# Patient Record
Sex: Male | Born: 1945 | Race: White | Hispanic: No | Marital: Married | Smoking: Former smoker
Health system: Southern US, Community
[De-identification: ages and names within clinical notes are randomized; demographics above are authoritative.]

## PROBLEM LIST (undated history)

## (undated) DIAGNOSIS — I10 Essential (primary) hypertension: Secondary | ICD-10-CM

## (undated) HISTORY — PX: TONSILLECTOMY: SUR1361

## (undated) HISTORY — PX: CHOLECYSTECTOMY: SHX55

## (undated) HISTORY — DX: Essential (primary) hypertension: I10

---

## 2002-03-23 ENCOUNTER — Ambulatory Visit (HOSPITAL_COMMUNITY): Admission: RE | Admit: 2002-03-23 | Discharge: 2002-03-23 | Payer: Self-pay | Admitting: Gastroenterology

## 2003-12-30 ENCOUNTER — Observation Stay (HOSPITAL_COMMUNITY): Admission: EM | Admit: 2003-12-30 | Discharge: 2003-12-31 | Payer: Self-pay | Admitting: Emergency Medicine

## 2012-12-04 DIAGNOSIS — J019 Acute sinusitis, unspecified: Secondary | ICD-10-CM | POA: Insufficient documentation

## 2015-09-29 DIAGNOSIS — J209 Acute bronchitis, unspecified: Secondary | ICD-10-CM | POA: Diagnosis not present

## 2016-04-27 DIAGNOSIS — Z125 Encounter for screening for malignant neoplasm of prostate: Secondary | ICD-10-CM | POA: Diagnosis not present

## 2016-04-27 DIAGNOSIS — D509 Iron deficiency anemia, unspecified: Secondary | ICD-10-CM | POA: Diagnosis not present

## 2016-04-27 DIAGNOSIS — Z0000 Encounter for general adult medical examination without abnormal findings: Secondary | ICD-10-CM | POA: Diagnosis not present

## 2016-04-27 DIAGNOSIS — I10 Essential (primary) hypertension: Secondary | ICD-10-CM | POA: Diagnosis not present

## 2016-04-27 DIAGNOSIS — E782 Mixed hyperlipidemia: Secondary | ICD-10-CM | POA: Diagnosis not present

## 2016-04-27 DIAGNOSIS — K219 Gastro-esophageal reflux disease without esophagitis: Secondary | ICD-10-CM | POA: Diagnosis not present

## 2016-04-27 DIAGNOSIS — Z23 Encounter for immunization: Secondary | ICD-10-CM | POA: Diagnosis not present

## 2016-04-27 DIAGNOSIS — L570 Actinic keratosis: Secondary | ICD-10-CM | POA: Diagnosis not present

## 2016-10-09 DIAGNOSIS — L570 Actinic keratosis: Secondary | ICD-10-CM | POA: Diagnosis not present

## 2016-10-09 DIAGNOSIS — B351 Tinea unguium: Secondary | ICD-10-CM | POA: Diagnosis not present

## 2017-06-04 DIAGNOSIS — Z0000 Encounter for general adult medical examination without abnormal findings: Secondary | ICD-10-CM | POA: Diagnosis not present

## 2017-06-04 DIAGNOSIS — I10 Essential (primary) hypertension: Secondary | ICD-10-CM | POA: Diagnosis not present

## 2017-06-04 DIAGNOSIS — Z23 Encounter for immunization: Secondary | ICD-10-CM | POA: Diagnosis not present

## 2017-06-04 DIAGNOSIS — K219 Gastro-esophageal reflux disease without esophagitis: Secondary | ICD-10-CM | POA: Diagnosis not present

## 2017-09-11 DIAGNOSIS — Z0000 Encounter for general adult medical examination without abnormal findings: Secondary | ICD-10-CM | POA: Diagnosis not present

## 2017-11-25 DIAGNOSIS — H43811 Vitreous degeneration, right eye: Secondary | ICD-10-CM | POA: Diagnosis not present

## 2017-11-25 DIAGNOSIS — I959 Hypotension, unspecified: Secondary | ICD-10-CM | POA: Diagnosis not present

## 2017-11-25 DIAGNOSIS — R002 Palpitations: Secondary | ICD-10-CM | POA: Diagnosis not present

## 2017-11-25 DIAGNOSIS — I10 Essential (primary) hypertension: Secondary | ICD-10-CM | POA: Diagnosis not present

## 2017-11-25 DIAGNOSIS — H35372 Puckering of macula, left eye: Secondary | ICD-10-CM | POA: Diagnosis not present

## 2017-11-25 DIAGNOSIS — H25813 Combined forms of age-related cataract, bilateral: Secondary | ICD-10-CM | POA: Diagnosis not present

## 2018-01-09 DIAGNOSIS — H25813 Combined forms of age-related cataract, bilateral: Secondary | ICD-10-CM | POA: Diagnosis not present

## 2018-01-09 DIAGNOSIS — H43811 Vitreous degeneration, right eye: Secondary | ICD-10-CM | POA: Diagnosis not present

## 2018-01-09 DIAGNOSIS — H35372 Puckering of macula, left eye: Secondary | ICD-10-CM | POA: Diagnosis not present

## 2018-01-09 DIAGNOSIS — H04123 Dry eye syndrome of bilateral lacrimal glands: Secondary | ICD-10-CM | POA: Diagnosis not present

## 2018-02-05 DIAGNOSIS — R002 Palpitations: Secondary | ICD-10-CM | POA: Diagnosis not present

## 2018-02-05 DIAGNOSIS — R509 Fever, unspecified: Secondary | ICD-10-CM | POA: Diagnosis not present

## 2018-02-05 DIAGNOSIS — I959 Hypotension, unspecified: Secondary | ICD-10-CM | POA: Diagnosis not present

## 2018-02-05 DIAGNOSIS — I10 Essential (primary) hypertension: Secondary | ICD-10-CM | POA: Diagnosis not present

## 2018-03-12 ENCOUNTER — Encounter: Payer: Self-pay | Admitting: Cardiology

## 2018-03-12 ENCOUNTER — Ambulatory Visit: Payer: Medicare Other | Admitting: Cardiology

## 2018-03-12 VITALS — BP 138/84 | HR 81 | Ht 69.0 in | Wt 196.0 lb

## 2018-03-12 DIAGNOSIS — R002 Palpitations: Secondary | ICD-10-CM | POA: Diagnosis not present

## 2018-03-12 DIAGNOSIS — I10 Essential (primary) hypertension: Secondary | ICD-10-CM | POA: Diagnosis not present

## 2018-03-12 DIAGNOSIS — H25813 Combined forms of age-related cataract, bilateral: Secondary | ICD-10-CM | POA: Diagnosis not present

## 2018-03-12 DIAGNOSIS — H35372 Puckering of macula, left eye: Secondary | ICD-10-CM | POA: Diagnosis not present

## 2018-03-12 DIAGNOSIS — H40013 Open angle with borderline findings, low risk, bilateral: Secondary | ICD-10-CM | POA: Diagnosis not present

## 2018-03-12 DIAGNOSIS — I959 Hypotension, unspecified: Secondary | ICD-10-CM | POA: Diagnosis not present

## 2018-03-12 DIAGNOSIS — H43811 Vitreous degeneration, right eye: Secondary | ICD-10-CM | POA: Diagnosis not present

## 2018-03-12 NOTE — Patient Instructions (Addendum)
Medication Instructions:  Your physician recommends that you continue on your current medications as directed. Please refer to the Current Medication list given to you today.   Labwork: Your physician recommends that you return for lab work today: TSH.   Testing/Procedures: You had an ekg today.  Follow-Up: Your physician wants you to follow-up in: 8 weeks. You will receive a reminder letter in the mail two months in advance. If you don't receive a letter, please call our office to schedule the follow-up appointment.   Any Other Special Instructions Will Be Listed Below (If Applicable).     If you need a refill on your cardiac medications before your next appointment, please call your pharmacy.    KardiaMobile [email or web address]        FDA-cleared, clinical grade mobile EKG monitor: Jodelle Red is the most clinically-validated mobile EKG used by the world's leading cardiac care medical professionals With Basic service, know instantly if your heart rhythm is normal or if atrial fibrillation is detected, and email the last single EKG recording to yourself or your doctor Premium service, available for purchase through the Kardia app for $9.99 per month or $99 per year, includes unlimited history and storage of your EKG recordings, a monthly EKG summary report to share with your doctor, along with the ability to track your blood pressure, activity and weight Includes one KardiaMobile phone clip FREE SHIPPING: Standard delivery 1-3 business days. Orders placed by 11:00am PST will ship that afternoon. Otherwise, will ship next business day. All orders ship via [employer] from [place], [place]   1. Avoid all over-the-counter antihistamines except Claritin/Loratadine and Zyrtec/Cetrizine. 2. Avoid all combination including cold sinus allergies flu decongestant and sleep medications 3. You can use Robitussin DM Mucinex and Mucinex DM for cough. 4. can use  Tylenol aspirin ibuprofen and naproxen but no combinations such as sleep or sinus.

## 2018-03-12 NOTE — Progress Notes (Signed)
Cardiology Office Note:    Date:  03/12/2018   ID:  Gregory Vasquez, DOB [birth year], MRN [id number]  PCP:  [facility] [place]  Cardiologist:  Shirlee More, MD   Referring MD: [facility]  ASSESSMENT:    1. Hypotension, unspecified hypotension type   2. Essential hypertension   3. Palpitations    PLAN:    In order of problems listed above:  1. Symp blood pressures at target outside of these episodes tomatic hypotension is associated with this irregular and more rapid heart rhythm clinically I suspect atrial fibrillation we will utilize an smart phone adapter to catch episodes and guide treatment.  If as I suspect his atrial fibrillation he benefit from anticoagulation he has a moderate stroke risk with age hypertension and an antiarrhythmic drug to mitigate symptoms.  Prior to start antiarrhythmic drug I would do an echocardiogram to look for structural heart disease. 2. Stable continue current treatment ACE inhibitor calcium channel blocker 3. Episodes of more frequent prolonged and warrant further evaluation as described.  Check TSH because of strong association of arrhythmia especially atrial with thyroid disease.  I also asked him avoid over-the-counter proarrhythmic drugs  Next appointment 8 weeks   Medication Adjustments/Labs and Tests Ordered: Current medicines are reviewed at length with the patient today.  Concerns regarding medicines are outlined above.  Orders Placed This Encounter  Procedures  . TSH  . EKG 12-Lead   No orders of the defined types were placed in this encounter.    Chief Complaint  Patient presents with  . New Patient (Initial Visit)  . Hypotension  . Hypertension  . Palpitations    History of Present Illness:    Gregory Vasquez is a 72 y.o. male who is being seen today for the evaluation of hypotension at the request of Pa, [facility] He has a history of hypertension hyperlipidemia erectile  dysfunction he takes sildenafil , a calcium channel blocker  And ACE inhibitor.  He has had symptomatic hypotension with blood pressures less than 90 systolic as well as palpitation.  2 EKGs from years ago were normal last labs in October 2018 CBC CMP were normal.   He is having frequent episodes of his pulse beating more rapidly rates 100 215 forcefully and irregularly.  During these episodes he is lightheaded if he stands and his blood pressures less than 90 systolic.  There is no documentation of arrhythmia when it occurs it lasts up to a few hours.  And now is happening several times per week.  Many years ago he underwent evaluation for this including a 30-day event monitor that was unrevealing.  He has no known heart disease no alcohol abuse or sleep apnea his last labs were last October I do not see a thyroid and I will draw a TSH because of strong association of thyroid disease and atrial arrhythmia.  Clinically I suspect he is in paroxysmal atrial fibrillation he is at a moderate stroke risk with age and hypertension and if documented he would benefit from anticoagulation and antiarrhythmic drug to alleviate symptoms.  We discussed arming him with an event monitor but he is aware of the smart phone adapter Jodelle Red and he prefers to purchase this and record himself and was sent to my office by fax.  I will plan to see him back in the office in 8 weeks.  At this time I do not think he requires cardiac imaging or ischemia evaluation.  He is  having no chest pain no shortness of breath and outside of these episodes he really feels quite well and is vigorous and active.  Past Medical History:  Diagnosis Date  . Hypertension     Past Surgical History:  Procedure Laterality Date  . CHOLECYSTECTOMY    . TONSILLECTOMY      Current Medications: Current Meds  Medication Sig  . amLODipine (NORVASC) 5 MG tablet Take 5 mg by mouth daily.  Marland Kitchen aspirin 81 MG tablet Take 81 mg by mouth daily.  . Multiple  Vitamins-Minerals (MULTIVITAL PO) Take 1 tablet by mouth daily.   . Omega-3 Fatty Acids (FISH OIL PO) Take 1 tablet by mouth daily.   Marland Kitchen omeprazole (PRILOSEC) 20 MG capsule Take 20 mg by mouth daily.  . quinapril (ACCUPRIL) 40 MG tablet Take 20 mg by mouth daily.      Allergies:   Patient has no allergy information on record.   Social History   Socioeconomic History  . Marital status: Married    Spouse name: Not on file  . Number of children: Not on file  . Years of education: Not on file  . Highest education level: Not on file  Occupational History  . Not on file  Social Needs  . Financial resource strain: Not on file  . Food insecurity:    Worry: Not on file    Inability: Not on file  . Transportation needs:    Medical: Not on file    Non-medical: Not on file  Tobacco Use  . Smoking status: Former Smoker    Packs/day: 2.00    Years: 10.00    Pack years: 20.00    Types: Cigarettes    Last attempt to quit: 1976    Years since quitting: 43.5  . Smokeless tobacco: Never Used  Substance and Sexual Activity  . Alcohol use: Yes    Alcohol/week: 0.6 oz    Types: 1 Cans of beer per week    Comment: 1 beer every 2 -3 days  . Drug use: Not Currently  . Sexual activity: Not on file  Lifestyle  . Physical activity:    Days per week: Not on file    Minutes per session: Not on file  . Stress: Not on file  Relationships  . Social connections:    Talks on phone: Not on file    Gets together: Not on file    Attends religious service: Not on file    Active member of club or organization: Not on file    Attends meetings of clubs or organizations: Not on file    Relationship status: Not on file  Other Topics Concern  . Not on file  Social History Narrative  . Not on file     Family History: The patient's family history includes CAD in his mother; Heart attack in his father; Hyperlipidemia in his mother.  ROS:   Review of Systems  Constitution: Negative.  HENT: Negative.    Eyes: Negative.   Cardiovascular: Positive for palpitations.  Respiratory: Negative.   Endocrine: Negative.   Hematologic/Lymphatic: Negative.   Skin: Negative.   Musculoskeletal: Negative.   Gastrointestinal: Negative.   Genitourinary: Negative.   Neurological: Positive for dizziness.  Psychiatric/Behavioral: Negative.   Allergic/Immunologic: Negative.    Please see the history of present illness.     All other systems reviewed and are negative.  EKGs/Labs/Other Studies Reviewed:    The following studies were reviewed today:   EKG:  EKG is ordered  today.  The ekg ordered today demonstrates [facility] and is normal  Recent Labs: October 2018 CBC and CMP were normal No results found for requested labs within last 8760 hours.  Recent Lipid Panel No results found for: CHOL, TRIG, HDL, CHOLHDL, VLDL, LDLCALC, LDLDIRECT  Physical Exam:    VS:  BP 138/84 (BP Location: Left Arm, Patient Position: Sitting, Cuff Size: Normal)   Pulse 81   Ht 5\' 9"  (1.753 m)   Wt 196 lb (88.9 kg)   SpO2 98%   BMI 28.94 kg/m     Wt Readings from Last 3 Encounters:  03/12/18 196 lb (88.9 kg)     GEN: Well nourished, well developed in no acute distress HEENT: Normal NECK: No JVD; No carotid bruits LYMPHATICS: No lymphadenopathy CARDIAC: RRR, no murmurs, rubs, gallops RESPIRATORY:  Clear to auscultation without rales, wheezing or rhonchi  ABDOMEN: Soft, non-tender, non-distended MUSCULOSKELETAL:  No edema; No deformity  SKIN: Warm and dry NEUROLOGIC:  Alert and oriented x 3 PSYCHIATRIC:  Normal affect     Signed, Shirlee More, MD  03/12/2018 3:42 PM    [facility]

## 2018-03-13 LAB — TSH: TSH: 1.48 u[IU]/mL (ref 0.450–4.500)

## 2018-04-11 DIAGNOSIS — R05 Cough: Secondary | ICD-10-CM | POA: Diagnosis not present

## 2018-04-17 ENCOUNTER — Telehealth: Payer: Self-pay | Admitting: Cardiology

## 2018-04-17 DIAGNOSIS — I4891 Unspecified atrial fibrillation: Secondary | ICD-10-CM

## 2018-04-17 NOTE — Telephone Encounter (Signed)
Patient has ekg notes from cardia. Patient will email and will be given to Dr. Bettina Gavia to review.

## 2018-04-17 NOTE — Telephone Encounter (Signed)
New Message    Patient is calling because he has some EKG notes that he wants to send to Dr. Bettina Gavia. He says they can not be faxed and only emailed. Please call to discuss.

## 2018-04-17 NOTE — Telephone Encounter (Signed)
Left message for patient to return calls.

## 2018-04-24 MED ORDER — METOPROLOL SUCCINATE ER 25 MG PO TB24: 25.0000 mg | ORAL_TABLET | Freq: Every day | ORAL | 3 refills | Status: DC

## 2018-04-24 MED ORDER — APIXABAN 5 MG PO TABS: 5.0000 mg | ORAL_TABLET | Freq: Two times a day (BID) | ORAL | 3 refills | Status: DC

## 2018-04-24 NOTE — Telephone Encounter (Signed)
Per Dr. Maud Deed EKG strips show atrial fibrillation and advised that he stop taking aspirin 81 mg and start taking eliquis 5 mg twice daily in addition to metoprolol succinate 25 mg daily. Prescriptions sent to [facility]  in [place] as requested. Dr. Bettina Gavia advised patient to have echocardiogram as well and to follow up with him in the [facility] office after echocardiogram has been completed. Informed patient he would be contacted to schedule appointments. Patient verbalized understanding, no further questions.

## 2018-05-01 ENCOUNTER — Encounter: Payer: Self-pay | Admitting: Cardiology

## 2018-05-02 ENCOUNTER — Telehealth: Payer: Self-pay | Admitting: *Deleted

## 2018-05-02 ENCOUNTER — Telehealth: Payer: Self-pay | Admitting: Emergency Medicine

## 2018-05-02 NOTE — Telephone Encounter (Signed)
Contacted patient to inform him that Dr. Bettina Gavia has reviewed kardia strips that were sent to our office and they showed atrial fibrillation. He has been scheduled for an office visit on Tuesday, 05/06/18 at 3:20 pm in the [facility] location per Dr. Joya Gaskins request. Patient verbalized understanding. No further questions.

## 2018-05-05 NOTE — Telephone Encounter (Signed)
Opened encounter in error  

## 2018-05-06 ENCOUNTER — Ambulatory Visit: Payer: Medicare Other | Admitting: Cardiology

## 2018-05-06 ENCOUNTER — Encounter: Payer: Self-pay | Admitting: Cardiology

## 2018-05-06 VITALS — BP 102/62 | HR 73 | Wt 195.8 lb

## 2018-05-06 DIAGNOSIS — I480 Paroxysmal atrial fibrillation: Secondary | ICD-10-CM | POA: Diagnosis not present

## 2018-05-06 DIAGNOSIS — I10 Essential (primary) hypertension: Secondary | ICD-10-CM

## 2018-05-06 DIAGNOSIS — Z7901 Long term (current) use of anticoagulants: Secondary | ICD-10-CM | POA: Diagnosis not present

## 2018-05-06 NOTE — Progress Notes (Signed)
Cardiology Office Note:    Date:  05/06/2018   ID:  Gregory Vasquez, DOB Sep 19, 1945, MRN [id number]  PCP:  [facility] [place]  Cardiologist:  Shirlee More, MD    Referring MD: [facility]    ASSESSMENT:    1. Essential hypertension   2. PAF (paroxysmal atrial fibrillation) ([place])   3. Chronic anticoagulation    PLAN:    In order of problems listed above:  1. Worsened systolic blood pressure as low as 834 systolic he will stop his calcium channel blocker Dr. home blood pressure 2. Documented with a home smart phone adapter he is improved with beta-blocker pleased with the quality of his life will continue the same along with anticoagulation with a chads 2 score of 2 3. Able continue his current anticoagulant he has had no bleeding complication   Next appointment: 6 months   Medication Adjustments/Labs and Tests Ordered: Current medicines are reviewed at length with the patient today.  Concerns regarding medicines are outlined above.  No orders of the defined types were placed in this encounter.  No orders of the defined types were placed in this encounter.   Chief Complaint  Patient presents with  . Atrial Fibrillation    detected on Kardia    History of Present Illness:    Gregory Vasquez is a 72 y.o. male with a hx of PAF captured with Kardia  last seen 03/12/18. He started on apixaban and a beta blocker and is improved with fewer brief less symptomatic and rapid episodes < 10 minute duration.  He tolerates his anticoagulant without bleeding complication or discussion of benefits risk and options of ongoing treatment as we are beta-blocker anticoagulant antiarrhythmic drug or referral to catheter ablation prefers his present treatment.  He will continue to monitor the symptomatic burden of his atrial fibrillation and if he continues to do well I see him back in 6 months of worsened we can initiate an antiarrhythmic drug as an outpatient.  No  chest pain syncope or TIA Compliance with diet, lifestyle and medications: Yes Past Medical History:  Diagnosis Date  . Hypertension     Past Surgical History:  Procedure Laterality Date  . CHOLECYSTECTOMY    . TONSILLECTOMY      Current Medications: Current Meds  Medication Sig  . apixaban (ELIQUIS) 5 MG TABS tablet Take 1 tablet (5 mg total) by mouth 2 (two) times daily.  . metoprolol succinate (TOPROL-XL) 25 MG 24 hr tablet Take 1 tablet (25 mg total) by mouth daily.  . Multiple Vitamins-Minerals (MULTIVITAL PO) Take 1 tablet by mouth daily.   . Omega-3 Fatty Acids (FISH OIL PO) Take 1 tablet by mouth daily.   Marland Kitchen omeprazole (PRILOSEC) 20 MG capsule Take 10 mg by mouth daily.   . quinapril (ACCUPRIL) 40 MG tablet Take 20 mg by mouth daily.   . [DISCONTINUED] amLODipine (NORVASC) 5 MG tablet Take 5 mg by mouth daily.     Allergies:   Patient has no allergy information on record.   Social History   Socioeconomic History  . Marital status: Married    Spouse name: Not on file  . Number of children: Not on file  . Years of education: Not on file  . Highest education level: Not on file  Occupational History  . Not on file  Social Needs  . Financial resource strain: Not on file  . Food insecurity:    Worry: Not on file    Inability:  Not on file  . Transportation needs:    Medical: Not on file    Non-medical: Not on file  Tobacco Use  . Smoking status: Former Smoker    Packs/day: 2.00    Years: 10.00    Pack years: 20.00    Types: Cigarettes    Last attempt to quit: 1976    Years since quitting: 43.7  . Smokeless tobacco: Never Used  Substance and Sexual Activity  . Alcohol use: Yes    Alcohol/week: 1.0 standard drinks    Types: 1 Cans of beer per week    Comment: 1 beer every 2 -3 days  . Drug use: Not Currently  . Sexual activity: Not on file  Lifestyle  . Physical activity:    Days per week: Not on file    Minutes per session: Not on file  . Stress: Not on  file  Relationships  . Social connections:    Talks on phone: Not on file    Gets together: Not on file    Attends religious service: Not on file    Active member of club or organization: Not on file    Attends meetings of clubs or organizations: Not on file    Relationship status: Not on file  Other Topics Concern  . Not on file  Social History Narrative  . Not on file     Family History: The patient's family history includes CAD in his mother; Heart attack in his father; Hyperlipidemia in his mother. ROS:   Please see the history of present illness.    All other systems reviewed and are negative.  EKGs/Labs/Other Studies Reviewed:    The following studies were reviewed today:  Reviewed his home EKG strips is 7 documented episodes of atrial relation  Recent Labs: 03/12/2018: TSH 1.480  Recent Lipid Panel No results found for: CHOL, TRIG, HDL, CHOLHDL, VLDL, LDLCALC, LDLDIRECT  Physical Exam:    VS:  BP 102/62 (BP Location: Right Arm, Patient Position: Sitting, Cuff Size: Normal)   Pulse 73   Wt 195 lb 12.8 oz (88.8 kg)   SpO2 97%   BMI 28.91 kg/m     Wt Readings from Last 3 Encounters:  05/06/18 195 lb 12.8 oz (88.8 kg)  03/12/18 196 lb (88.9 kg)     GEN:  Well nourished, well developed in no acute distress HEENT: Normal NECK: No JVD; No carotid bruits LYMPHATICS: No lymphadenopathy CARDIAC: RRR, no murmurs, rubs, gallops RESPIRATORY:  Clear to auscultation without rales, wheezing or rhonchi  ABDOMEN: Soft, non-tender, non-distended MUSCULOSKELETAL:  No edema; No deformity  SKIN: Warm and dry NEUROLOGIC:  Alert and oriented x 3 PSYCHIATRIC:  Normal affect    Signed, Shirlee More, MD  05/06/2018 3:52 PM    [facility]

## 2018-05-06 NOTE — Patient Instructions (Addendum)
Medication Instructions:  Your physician has recommended you make the following change in your medication:   STOP amlodipine (norvasc)   Labwork: None  Testing/Procedures: None  Follow-Up: Your physician wants you to follow-up in: 6 months. You will receive a reminder letter in the mail two months in advance. If you don't receive a letter, please call our office to schedule the follow-up appointment.   If you need a refill on your cardiac medications before your next appointment, please call your pharmacy.   Thank you for choosing [facility]! Robyne Peers, RN [phone]      Atrial Fibrillation Atrial fibrillation is a type of heartbeat that is irregular or fast (rapid). If you have this condition, your heart keeps quivering in a weird (chaotic) way. This condition can make it so your heart cannot pump blood normally. Having this condition gives a person more risk for stroke, heart failure, and other heart problems. There are different types of atrial fibrillation. Talk with your doctor to learn about the type that you have. Follow these instructions at home:  Take over-the-counter and prescription medicines only as told by your doctor.  If your doctor prescribed a blood-thinning medicine, take it exactly as told. Taking too much of it can cause bleeding. If you do not take enough of it, you will not have the protection that you need against stroke and other problems.  Do not use any tobacco products. These include cigarettes, chewing tobacco, and e-cigarettes. If you need help quitting, ask your doctor.  If you have apnea (obstructive sleep apnea), manage it as told by your doctor.  Do not drink alcohol.  Do not drink beverages that have caffeine. These include coffee, soda, and tea.  Maintain a healthy weight. Do not use diet pills unless your doctor says they are safe for you. Diet pills may make heart problems worse.  Follow diet instructions as told by  your doctor.  Exercise regularly as told by your doctor.  Keep all follow-up visits as told by your doctor. This is important. Contact a doctor if:  You notice a change in the speed, rhythm, or strength of your heartbeat.  You are taking a blood-thinning medicine and you notice more bruising.  You get tired more easily when you move or exercise. Get help right away if:  You have pain in your chest or your belly (abdomen).  You have sweating or weakness.  You feel sick to your stomach (nauseous).  You notice blood in your throw up (vomit), poop (stool), or pee (urine).  You are short of breath.  You suddenly have swollen feet and ankles.  You feel dizzy.  Your suddenly get weak or numb in your face, arms, or legs, especially if it happens on one side of your body.  You have trouble talking, trouble understanding, or both.  Your face or your eyelid droops on one side. These symptoms may be an emergency. Do not wait to see if the symptoms will go away. Get medical help right away. Call your local emergency services (911 in the [place] Do not drive yourself to the hospital. This information is not intended to replace advice given to you by your health care provider. Make sure you discuss any questions you have with your health care provider. Document Released: 05/15/2008 Document Revised: 01/12/2016 Document Reviewed: 12/01/2014 Elsevier Interactive Patient Education  [employer].

## 2018-05-12 ENCOUNTER — Ambulatory Visit (HOSPITAL_BASED_OUTPATIENT_CLINIC_OR_DEPARTMENT_OTHER)
Admission: RE | Admit: 2018-05-12 | Discharge: 2018-05-12 | Disposition: A | Discharge status: Home / Self Care | Payer: Medicare Other | Source: Ambulatory Visit | Attending: Cardiology | Admitting: Cardiology

## 2018-05-12 DIAGNOSIS — I071 Rheumatic tricuspid insufficiency: Secondary | ICD-10-CM | POA: Diagnosis not present

## 2018-05-12 DIAGNOSIS — I119 Hypertensive heart disease without heart failure: Secondary | ICD-10-CM | POA: Insufficient documentation

## 2018-05-12 DIAGNOSIS — I4891 Unspecified atrial fibrillation: Secondary | ICD-10-CM | POA: Diagnosis not present

## 2018-05-12 NOTE — Progress Notes (Signed)
  Echocardiogram 2D Echocardiogram has been performed.  Simone Tuckey T Othar Curto 05/12/2018, 11:14 AM

## 2018-05-14 DIAGNOSIS — H40013 Open angle with borderline findings, low risk, bilateral: Secondary | ICD-10-CM | POA: Diagnosis not present

## 2018-05-14 DIAGNOSIS — H25813 Combined forms of age-related cataract, bilateral: Secondary | ICD-10-CM | POA: Diagnosis not present

## 2018-05-14 DIAGNOSIS — H43811 Vitreous degeneration, right eye: Secondary | ICD-10-CM | POA: Diagnosis not present

## 2018-05-14 DIAGNOSIS — H35372 Puckering of macula, left eye: Secondary | ICD-10-CM | POA: Diagnosis not present

## 2018-05-20 ENCOUNTER — Encounter (INDEPENDENT_AMBULATORY_CARE_PROVIDER_SITE_OTHER): Payer: Medicare Other | Admitting: Ophthalmology

## 2018-05-20 DIAGNOSIS — H35033 Hypertensive retinopathy, bilateral: Secondary | ICD-10-CM

## 2018-05-20 DIAGNOSIS — D3132 Benign neoplasm of left choroid: Secondary | ICD-10-CM | POA: Diagnosis not present

## 2018-05-20 DIAGNOSIS — I10 Essential (primary) hypertension: Secondary | ICD-10-CM | POA: Diagnosis not present

## 2018-05-20 DIAGNOSIS — H35372 Puckering of macula, left eye: Secondary | ICD-10-CM

## 2018-05-20 DIAGNOSIS — H2513 Age-related nuclear cataract, bilateral: Secondary | ICD-10-CM

## 2018-05-22 ENCOUNTER — Ambulatory Visit: Payer: Medicare Other | Admitting: Cardiology

## 2018-06-25 DIAGNOSIS — I10 Essential (primary) hypertension: Secondary | ICD-10-CM | POA: Diagnosis not present

## 2018-06-25 DIAGNOSIS — E782 Mixed hyperlipidemia: Secondary | ICD-10-CM | POA: Diagnosis not present

## 2018-06-25 DIAGNOSIS — Z23 Encounter for immunization: Secondary | ICD-10-CM | POA: Diagnosis not present

## 2018-06-25 DIAGNOSIS — Z0000 Encounter for general adult medical examination without abnormal findings: Secondary | ICD-10-CM | POA: Diagnosis not present

## 2018-07-09 ENCOUNTER — Encounter: Payer: Self-pay | Admitting: Cardiology

## 2018-07-09 ENCOUNTER — Ambulatory Visit (INDEPENDENT_AMBULATORY_CARE_PROVIDER_SITE_OTHER): Payer: Medicare Other | Admitting: Cardiology

## 2018-07-09 VITALS — BP 98/62 | HR 120 | Ht 70.0 in | Wt 193.1 lb

## 2018-07-09 DIAGNOSIS — I10 Essential (primary) hypertension: Secondary | ICD-10-CM | POA: Diagnosis not present

## 2018-07-09 DIAGNOSIS — Z01818 Encounter for other preprocedural examination: Secondary | ICD-10-CM | POA: Diagnosis not present

## 2018-07-09 DIAGNOSIS — I480 Paroxysmal atrial fibrillation: Secondary | ICD-10-CM

## 2018-07-09 MED ORDER — DRONEDARONE HCL 400 MG PO TABS: 400.0000 mg | ORAL_TABLET | Freq: Two times a day (BID) | ORAL | 3 refills | Status: DC

## 2018-07-09 NOTE — Progress Notes (Signed)
Cardiology Office Note:    Date:  07/09/2018   ID:  Gregory Vasquez, DOB Mar 24, 1946, MRN [id number]  PCP:  Lawerance Cruel, MD  Cardiologist:  Shirlee More, MD    Referring MD: Lawerance Cruel, MD    ASSESSMENT:    1. PAF (paroxysmal atrial fibrillation) ([place])   2. Pre-op evaluation   3. Essential hypertension    PLAN:    In order of problems listed above:  1. He has been in atrial fibrillation since the 16th he has at home monitor for his iPhone and his document his arrhythmia.  He is symptomatic with it with lightheadedness and weakness confirmed on today's EKG we will plan cardioversion as an outpatient I gave him prescription for Multaq to initiate if successful.  Is been compliant with and will continue his anticoagulant.  If he fails to convert referral to EP 2. Retention continue current treatment   Next appointment: Weeks   Medication Adjustments/Labs and Tests Ordered: Current medicines are reviewed at length with the patient today.  Concerns regarding medicines are outlined above.  Orders Placed This Encounter  Procedures  . CBC  . Basic Metabolic Panel (BMET)  . INR/PT  . EKG 12-Lead   Meds ordered this encounter  Medications  . dronedarone (MULTAQ) 400 MG tablet    Sig: Take 1 tablet (400 mg total) by mouth 2 (two) times daily with a meal.    Dispense:  60 tablet    Refill:  3    Chief Complaint  Patient presents with  . Atrial Fibrillation    History of Present Illness:    Gregory Vasquez is a 72 y.o. male with a hx of PAF captured with Kardia  last seen 03/12/18. He started on apixaban and a beta blocker  last seen 05/06/18. Compliance with diet, lifestyle and medications: Yes  The 16th he has been in atrial fibrillation he feels weak exercise intolerance palpitation no chest pain shortness of breath or syncope.  He is compliant with his diuretic as monitor strips from his iPhone adapter and confirmed atrial fibrillation in the office.  He  converts prior to cardioversion he will cancel the procedure Past Medical History:  Diagnosis Date  . Hypertension     Past Surgical History:  Procedure Laterality Date  . CHOLECYSTECTOMY    . TONSILLECTOMY      Current Medications: Current Meds  Medication Sig  . apixaban (ELIQUIS) 5 MG TABS tablet Take 1 tablet (5 mg total) by mouth 2 (two) times daily.  . quinapril (ACCUPRIL) 20 MG tablet Take 20 mg by mouth daily.   . [DISCONTINUED] metoprolol succinate (TOPROL-XL) 25 MG 24 hr tablet Take 1 tablet (25 mg total) by mouth daily.  . [DISCONTINUED] Multiple Vitamins-Minerals (MULTIVITAL PO) Take 1 tablet by mouth daily.   . [DISCONTINUED] Omega-3 Fatty Acids (FISH OIL PO) Take 1 tablet by mouth daily.   . [DISCONTINUED] Omeprazole 20 MG TBEC Take 10 mg by mouth daily.      Allergies:   Patient has no known allergies.   Social History   Socioeconomic History  . Marital status: Married    Spouse name: Not on file  . Number of children: Not on file  . Years of education: Not on file  . Highest education level: Not on file  Occupational History  . Not on file  Social Needs  . Financial resource strain: Not on file  . Food insecurity:    Worry: Not on file  Inability: Not on file  . Transportation needs:    Medical: Not on file    Non-medical: Not on file  Tobacco Use  . Smoking status: Former Smoker    Packs/day: 2.00    Years: 10.00    Pack years: 20.00    Types: Cigarettes    Last attempt to quit: 1976    Years since quitting: 43.9  . Smokeless tobacco: Never Used  Substance and Sexual Activity  . Alcohol use: Yes    Alcohol/week: 1.0 standard drinks    Types: 1 Cans of beer per week    Comment: 1 beer every 2 -3 days  . Drug use: Not Currently  . Sexual activity: Not on file  Lifestyle  . Physical activity:    Days per week: Not on file    Minutes per session: Not on file  . Stress: Not on file  Relationships  . Social connections:    Talks on phone:  Not on file    Gets together: Not on file    Attends religious service: Not on file    Active member of club or organization: Not on file    Attends meetings of clubs or organizations: Not on file    Relationship status: Not on file  Other Topics Concern  . Not on file  Social History Narrative  . Not on file     Family History: The patient's family history includes CAD in his mother; Heart attack in his father; Hyperlipidemia in his mother. ROS:   Please see the history of present illness.    All other systems reviewed and are negative.  EKGs/Labs/Other Studies Reviewed:    The following studies were reviewed today:  EKG:  EKG ordered today.  The ekg ordered today demonstrates a trial fibrillation controlled rate  Recent Labs: 03/12/2018: TSH 1.480  Recent Lipid Panel No results found for: CHOL, TRIG, HDL, CHOLHDL, VLDL, LDLCALC, LDLDIRECT  Physical Exam:    VS:  BP 98/62 (BP Location: Right Arm, Patient Position: Sitting, Cuff Size: Normal)   Pulse (!) 120   Ht 5\' 10"  (1.778 m)   Wt 193 lb 1.9 oz (87.6 kg)   SpO2 96%   BMI 27.71 kg/m     Wt Readings from Last 3 Encounters:  07/09/18 193 lb 1.9 oz (87.6 kg)  05/06/18 195 lb 12.8 oz (88.8 kg)  03/12/18 196 lb (88.9 kg)     GEN:  Well nourished, well developed in no acute distress HEENT: Normal NECK: No JVD; No carotid bruits LYMPHATICS: No lymphadenopathy CARDIAC: Irr Irr variable s1 RESPIRATORY:  Clear to auscultation without rales, wheezing or rhonchi  ABDOMEN: Soft, non-tender, non-distended MUSCULOSKELETAL:  No edema; No deformity  SKIN: Warm and dry NEUROLOGIC:  Alert and oriented x 3 PSYCHIATRIC:  Normal affect    Signed, Shirlee More, MD  07/09/2018 4:48 PM    [facility]

## 2018-07-09 NOTE — H&P (View-Only) (Signed)
Cardiology Office Note:    Date:  07/09/2018   ID:  Gregory Vasquez, DOB 01-17-1946, MRN [id number]  PCP:  Lawerance Cruel, MD  Cardiologist:  Shirlee More, MD    Referring MD: Lawerance Cruel, MD    ASSESSMENT:    1. PAF (paroxysmal atrial fibrillation) ([place])   2. Pre-op evaluation   3. Essential hypertension    PLAN:    In order of problems listed above:  1. He has been in atrial fibrillation since the 16th he has at home monitor for his iPhone and his document his arrhythmia.  He is symptomatic with it with lightheadedness and weakness confirmed on today's EKG we will plan cardioversion as an outpatient I gave him prescription for Multaq to initiate if successful.  Is been compliant with and will continue his anticoagulant.  If he fails to convert referral to EP 2. Retention continue current treatment   Next appointment: Weeks   Medication Adjustments/Labs and Tests Ordered: Current medicines are reviewed at length with the patient today.  Concerns regarding medicines are outlined above.  Orders Placed This Encounter  Procedures  . CBC  . Basic Metabolic Panel (BMET)  . INR/PT  . EKG 12-Lead   Meds ordered this encounter  Medications  . dronedarone (MULTAQ) 400 MG tablet    Sig: Take 1 tablet (400 mg total) by mouth 2 (two) times daily with a meal.    Dispense:  60 tablet    Refill:  3    Chief Complaint  Patient presents with  . Atrial Fibrillation    History of Present Illness:    Gregory Vasquez is a 72 y.o. male with a hx of PAF captured with Kardia  last seen 03/12/18. He started on apixaban and a beta blocker  last seen 05/06/18. Compliance with diet, lifestyle and medications: Yes  The 16th he has been in atrial fibrillation he feels weak exercise intolerance palpitation no chest pain shortness of breath or syncope.  He is compliant with his diuretic as monitor strips from his iPhone adapter and confirmed atrial fibrillation in the office.  He  converts prior to cardioversion he will cancel the procedure Past Medical History:  Diagnosis Date  . Hypertension     Past Surgical History:  Procedure Laterality Date  . CHOLECYSTECTOMY    . TONSILLECTOMY      Current Medications: Current Meds  Medication Sig  . apixaban (ELIQUIS) 5 MG TABS tablet Take 1 tablet (5 mg total) by mouth 2 (two) times daily.  . quinapril (ACCUPRIL) 20 MG tablet Take 20 mg by mouth daily.   . [DISCONTINUED] metoprolol succinate (TOPROL-XL) 25 MG 24 hr tablet Take 1 tablet (25 mg total) by mouth daily.  . [DISCONTINUED] Multiple Vitamins-Minerals (MULTIVITAL PO) Take 1 tablet by mouth daily.   . [DISCONTINUED] Omega-3 Fatty Acids (FISH OIL PO) Take 1 tablet by mouth daily.   . [DISCONTINUED] Omeprazole 20 MG TBEC Take 10 mg by mouth daily.      Allergies:   Patient has no known allergies.   Social History   Socioeconomic History  . Marital status: Married    Spouse name: Not on file  . Number of children: Not on file  . Years of education: Not on file  . Highest education level: Not on file  Occupational History  . Not on file  Social Needs  . Financial resource strain: Not on file  . Food insecurity:    Worry: Not on file  Inability: Not on file  . Transportation needs:    Medical: Not on file    Non-medical: Not on file  Tobacco Use  . Smoking status: Former Smoker    Packs/day: 2.00    Years: 10.00    Pack years: 20.00    Types: Cigarettes    Last attempt to quit: 1976    Years since quitting: 43.9  . Smokeless tobacco: Never Used  Substance and Sexual Activity  . Alcohol use: Yes    Alcohol/week: 1.0 standard drinks    Types: 1 Cans of beer per week    Comment: 1 beer every 2 -3 days  . Drug use: Not Currently  . Sexual activity: Not on file  Lifestyle  . Physical activity:    Days per week: Not on file    Minutes per session: Not on file  . Stress: Not on file  Relationships  . Social connections:    Talks on phone:  Not on file    Gets together: Not on file    Attends religious service: Not on file    Active member of club or organization: Not on file    Attends meetings of clubs or organizations: Not on file    Relationship status: Not on file  Other Topics Concern  . Not on file  Social History Narrative  . Not on file     Family History: The patient's family history includes CAD in his mother; Heart attack in his father; Hyperlipidemia in his mother. ROS:   Please see the history of present illness.    All other systems reviewed and are negative.  EKGs/Labs/Other Studies Reviewed:    The following studies were reviewed today:  EKG:  EKG ordered today.  The ekg ordered today demonstrates a trial fibrillation controlled rate  Recent Labs: 03/12/2018: TSH 1.480  Recent Lipid Panel No results found for: CHOL, TRIG, HDL, CHOLHDL, VLDL, LDLCALC, LDLDIRECT  Physical Exam:    VS:  BP 98/62 (BP Location: Right Arm, Patient Position: Sitting, Cuff Size: Normal)   Pulse (!) 120   Ht 5\' 10"  (1.778 m)   Wt 193 lb 1.9 oz (87.6 kg)   SpO2 96%   BMI 27.71 kg/m     Wt Readings from Last 3 Encounters:  07/09/18 193 lb 1.9 oz (87.6 kg)  05/06/18 195 lb 12.8 oz (88.8 kg)  03/12/18 196 lb (88.9 kg)     GEN:  Well nourished, well developed in no acute distress HEENT: Normal NECK: No JVD; No carotid bruits LYMPHATICS: No lymphadenopathy CARDIAC: Irr Irr variable s1 RESPIRATORY:  Clear to auscultation without rales, wheezing or rhonchi  ABDOMEN: Soft, non-tender, non-distended MUSCULOSKELETAL:  No edema; No deformity  SKIN: Warm and dry NEUROLOGIC:  Alert and oriented x 3 PSYCHIATRIC:  Normal affect    Signed, Shirlee More, MD  07/09/2018 4:48 PM    [facility]

## 2018-07-09 NOTE — Patient Instructions (Addendum)
Medication Instructions:  Your physician has recommended you make the following change in your medication:   START dronedarone (multaq) 400 mg: Take 1 tablet twice daily: DO NOT START UNTIL AFTER CARDIOVERSION!   If you need a refill on your cardiac medications before your next appointment, please call your pharmacy.   Lab work: Your physician recommends that you return for lab work today: CBC, BMP, PT/INR.   If you have labs (blood work) drawn today and your tests are completely normal, you will receive your results only by: Marland Kitchen MyChart Message (if you have MyChart) OR . A paper copy in the mail If you have any lab test that is abnormal or we need to change your treatment, we will call you to review the results.  Testing/Procedures: You had an EKG today.   You are scheduled for a Cardioversion on Monday, 07/14/18 with Dr. Meda Coffee.  Please arrive at the [facility] (Main Entrance A) at [facility]: [address] [place], [place] [address] at 11 am.  DIET: Nothing to eat or drink after midnight except a sip of water with medications (see medication instructions below)  Medication Instructions:  Continue your anticoagulant: eliquis You will need to continue your anticoagulant after your procedure until you  are told by your  Provider that it is safe to stop   Labs: None needed You must have a responsible person to drive you home and stay in the waiting area during your procedure. Failure to do so could result in cancellation.  Bring your insurance cards.  *Special Note: Every effort is made to have your procedure done on time. Occasionally there are emergencies that occur at the hospital that may cause delays. Please be patient if a delay does occur.    Follow-Up: At [facility], you and your health needs are our priority.  As part of our continuing mission to provide you with exceptional heart care, we have created designated Provider Care Teams.  These Care Teams  include your primary Cardiologist (physician) and Advanced Practice Providers (APPs -  Physician Assistants and Nurse Practitioners) who all work together to provide you with the care you need, when you need it. You will need a follow up appointment in 1 months.    **You will be scheduled for a nurse visit to have an EKG one week after starting multaq!     Dronedarone tablets What is this medicine? DRONEDARONE (droe NE da rone) is an antiarrhythmic drug. It helps make your heart beat regularly. This medicine may be used for other purposes; ask your health care provider or pharmacist if you have questions. COMMON BRAND NAME(S): Multaq What should I tell my health care provider before I take this medicine? They need to know if you have any of these conditions: -heart failure -history of irregular heartbeat -liver disease -liver or lung problems with the past use of amiodarone -low levels of magnesium in the blood -low levels of potassium in the blood -other heart disease -an unusual or allergic reaction to dronedarone, other medicines, foods, dyes, or preservatives -pregnant or trying to get pregnant -breast-feeding How should I use this medicine? Take this medicine by mouth with a glass of water. Follow the directions on the prescription label. Take one tablet with the morning meal and one tablet with the evening meal. Do not take your medicine more often than directed. Do not stop taking except on the advice of your doctor or health care professional. A special MedGuide will be given  to you by the pharmacist with each prescription and refill. Be sure to read this information carefully each time. Talk to your pediatrician regarding the use of this medicine in children. Special care may be needed. Overdosage: If you think you have taken too much of this medicine contact a poison control center or emergency room at once. NOTE: This medicine is only for you. Do not share this medicine with  others. What if I miss a dose? If you miss a dose, take it as soon as you can. If it is almost time for your next dose, take only that dose. Do not take double or extra doses. What may interact with this medicine? Do not take this medicine with any of the following medications: -arsenic trioxide -certain antibiotics like clarithromycin, erythromycin, pentamidine, telithromycin, troleandomycin -certain medicines for depression like tricyclic antidepressants -certain medicines for fungal infections like fluconazole, itraconazole, ketoconazole, posaconazole, voriconazole -certain medicines for irregular heart beat like amiodarone, disopyramide, dofetilide, flecainide, ibutilide, quinidine, propafenone, sotalol -certain medicines for malaria like chloroquine, halofantrine -cisapride -cyclosporine -droperidol -haloperidol -methadone -other medicines that prolong the QT interval (cause an abnormal heart rhythm) -pimozide -nefazodone -phenothiazines like chlorpromazine, mesoridazine, prochlorperazine, thioridazine -ritonavir -ziprasidone This medicine may also interact with the following medications: -certain medicines for blood pressure, heart disease, or irregular heart beat like diltiazem, metoprolol, propranolol, verapamil -certain medicines for cholesterol like atorvastatin, lovastatin, simvastatin -certain medicines for seizures like carbamazepine, phenobarbital, phenytoin -digoxin -grapefruit juice -rifampin -sirolimus -St. John's Wort -tacrolimus This list may not describe all possible interactions. Give your health care provider a list of all the medicines, herbs, non-prescription drugs, or dietary supplements you use. Also tell them if you smoke, drink alcohol, or use illegal drugs. Some items may interact with your medicine. What should I watch for while using this medicine? Your condition will be monitored closely when you first begin therapy. Often, this drug is first started  in a hospital or other monitored health care setting. Once you are on maintenance therapy, visit your doctor or health care professional for regular checks on your progress. Because your condition and use of this medicine carry some risk, it is a good idea to carry an identification card, necklace or bracelet with details of your condition, medications, and doctor or health care professional. Dennis Bast may get drowsy or dizzy. Do not drive, use machinery, or do anything that needs mental alertness until you know how this medicine affects you. Do not stand or sit up quickly, especially if you are an older patient. This reduces the risk of dizzy or fainting spells. What side effects may I notice from receiving this medicine? Side effects that you should report to your doctor or health care professional as soon as possible: -allergic reactions like skin rash, itching or hives, swelling of the face, lips, or tongue -breathing problems -cough -dark urine -fast, irregular heartbeat -general ill feeling or flu-like symptoms -light-colored stools -loss of appetite, nausea -right upper belly pain -slow heartbeat -stomach pain -swelling of the legs or ankles -unusually weak or tired -weight gain -yellowing of the eyes or skin Side effects that usually do not require medical attention (report to your doctor or health care professional if they continue or are bothersome): -nausea -vomiting -stomach pain This list may not describe all possible side effects. Call your doctor for medical advice about side effects. You may report side effects to FDA at 1-800-FDA-1088. Where should I keep my medicine? Keep out of the reach of children. Store at room temperature  between 15 and 30 degrees C (59 and 86 degrees F). Throw away any unused medicine after the expiration date. NOTE: This sheet is a summary. It may not cover all possible information. If you have questions about this medicine, talk to your doctor,  pharmacist, or health care provider.  2018 Elsevier/Gold Standard (2015-09-08 12:43:06)     Electrical Cardioversion, Care After This sheet gives you information about how to care for yourself after your procedure. Your health care provider may also give you more specific instructions. If you have problems or questions, contact your health care provider. What can I expect after the procedure? After the procedure, it is common to have:  Some redness on the skin where the shocks were given.  Follow these instructions at home:  Do not drive for 24 hours if you were given a medicine to help you relax (sedative).  Take over-the-counter and prescription medicines only as told by your health care provider.  Ask your health care provider how to check your pulse. Check it often.  Rest for 48 hours after the procedure or as told by your health care provider.  Avoid or limit your caffeine use as told by your health care provider. Contact a health care provider if:  You feel like your heart is beating too quickly or your pulse is not regular.  You have a serious muscle cramp that does not go away. Get help right away if:  You have discomfort in your chest.  You are dizzy or you feel faint.  You have trouble breathing or you are short of breath.  Your speech is slurred.  You have trouble moving an arm or leg on one side of your body.  Your fingers or toes turn cold or blue. This information is not intended to replace advice given to you by your health care provider. Make sure you discuss any questions you have with your health care provider. Document Released: 05/27/2013 Document Revised: 03/09/2016 Document Reviewed: 02/10/2016 Elsevier Interactive Patient Education  [employer].

## 2018-07-10 LAB — CBC
Hematocrit: 42.9 % (ref 37.5–51.0)
Hemoglobin: 13.6 g/dL (ref 13.0–17.7)
MCH: 23.8 pg — ABNORMAL LOW (ref 26.6–33.0)
MCHC: 31.7 g/dL (ref 31.5–35.7)
MCV: 75 fL — AB (ref 79–97)
Platelets: 249 10*3/uL (ref 150–450)
RBC: 5.71 x10E6/uL (ref 4.14–5.80)
RDW: 15.7 % — AB (ref 12.3–15.4)
WBC: 6.5 10*3/uL (ref 3.4–10.8)

## 2018-07-10 LAB — BASIC METABOLIC PANEL
BUN/Creatinine Ratio: 10 (ref 10–24)
BUN: 12 mg/dL (ref 8–27)
CHLORIDE: 101 mmol/L (ref 96–106)
CO2: 21 mmol/L (ref 20–29)
Calcium: 9.5 mg/dL (ref 8.6–10.2)
Creatinine, Ser: 1.24 mg/dL (ref 0.76–1.27)
GFR calc non Af Amer: 57 mL/min/{1.73_m2} — ABNORMAL LOW (ref >59)
GFR, EST AFRICAN AMERICAN: 66 mL/min/{1.73_m2} (ref >59)
Glucose: 124 mg/dL — ABNORMAL HIGH (ref 65–99)
Potassium: 4.9 mmol/L (ref 3.5–5.2)
Sodium: 138 mmol/L (ref 134–144)

## 2018-07-10 LAB — PROTIME-INR
INR: 1.1 (ref 0.8–1.2)
Prothrombin Time: 11.6 s (ref 9.1–12.0)

## 2018-07-11 ENCOUNTER — Telehealth: Payer: Self-pay | Admitting: Cardiology

## 2018-07-14 ENCOUNTER — Telehealth: Payer: Self-pay | Admitting: Cardiology

## 2018-07-14 ENCOUNTER — Encounter (HOSPITAL_COMMUNITY): Payer: Self-pay | Admitting: Anesthesiology

## 2018-07-14 ENCOUNTER — Encounter (HOSPITAL_COMMUNITY)
Admission: RE | Disposition: A | Discharge status: Home / Self Care | Payer: Self-pay | Source: Ambulatory Visit | Attending: Cardiology

## 2018-07-14 ENCOUNTER — Encounter (HOSPITAL_COMMUNITY): Payer: Self-pay | Admitting: *Deleted

## 2018-07-14 ENCOUNTER — Ambulatory Visit (HOSPITAL_COMMUNITY)
Admission: RE | Admit: 2018-07-14 | Discharge: 2018-07-14 | Disposition: A | Discharge status: Home / Self Care | Payer: Medicare Other | Source: Ambulatory Visit | Attending: Cardiology | Admitting: Cardiology

## 2018-07-14 DIAGNOSIS — I10 Essential (primary) hypertension: Secondary | ICD-10-CM | POA: Diagnosis not present

## 2018-07-14 DIAGNOSIS — I480 Paroxysmal atrial fibrillation: Secondary | ICD-10-CM | POA: Diagnosis not present

## 2018-07-14 DIAGNOSIS — Z87891 Personal history of nicotine dependence: Secondary | ICD-10-CM | POA: Diagnosis not present

## 2018-07-14 DIAGNOSIS — Z79899 Other long term (current) drug therapy: Secondary | ICD-10-CM | POA: Diagnosis not present

## 2018-07-14 DIAGNOSIS — Z7901 Long term (current) use of anticoagulants: Secondary | ICD-10-CM | POA: Diagnosis not present

## 2018-07-14 SURGERY — CANCELLED PROCEDURE

## 2018-07-14 NOTE — Interval H&P Note (Signed)
History and Physical Interval Note:  07/14/2018 11:14 AM  Gregory Vasquez  has presented today for surgery, with the diagnosis of AFIB  The various methods of treatment have been discussed with the patient and family. After consideration of risks, benefits and other options for treatment, the patient has consented to  Procedure(s): CARDIOVERSION (N/A) as a surgical intervention .  The patient's history has been reviewed, patient examined, no change in status, stable for surgery.  I have reviewed the patient's chart and labs.  Questions were answered to the patient's satisfaction.     [name]

## 2018-07-14 NOTE — Progress Notes (Signed)
Patient admitted to Endoscopy for cardioversion. Patient placed on monitor and appeared to be in NSR. EKG and MD confirmed NSR. Patient cancelled prior to entering the procedure room.

## 2018-07-14 NOTE — Telephone Encounter (Signed)
Patient advised to start taking multaq today. Patient will return to the [facility] location for an EKG on Wednesday, 07/16/18 at 2:00 pm per Dr. Bettina Gavia. Patient verbalized understanding. No further questions.

## 2018-07-14 NOTE — Telephone Encounter (Signed)
Yes, start today EKG office Weds Afternoon

## 2018-07-14 NOTE — Anesthesia Preprocedure Evaluation (Deleted)
Anesthesia Evaluation  Patient identified by MRN, date of birth, ID band Patient awake    Reviewed: Allergy & Precautions, NPO status , Patient's Chart, lab work & pertinent test results  Airway        Dental   Pulmonary former smoker,           Cardiovascular hypertension, Pt. on home beta blockers + dysrhythmias Atrial Fibrillation   ECG: NSR, rate 85  ECHO: Normal LVEF. Normal LA size. Trace TR, MR.     Neuro/Psych negative neurological ROS  negative psych ROS   GI/Hepatic Neg liver ROS, GERD  Medicated,  Endo/Other  negative endocrine ROS  Renal/GU negative Renal ROS     Musculoskeletal negative musculoskeletal ROS (+)   Abdominal   Peds  Hematology negative hematology ROS (+)   Anesthesia Other Findings A-FIB  Reproductive/Obstetrics                             Anesthesia Physical Anesthesia Plan  ASA: III  Anesthesia Plan: General   Post-op Pain Management:    Induction: Intravenous  PONV Risk Score and Plan: 2 and Propofol infusion and Treatment may vary due to age or medical condition  Airway Management Planned: Mask  Additional Equipment:   Intra-op Plan:   Post-operative Plan:   Informed Consent: I have reviewed the patients History and Physical, chart, labs and discussed the procedure including the risks, benefits and alternatives for the proposed anesthesia with the patient or authorized representative who has indicated his/her understanding and acceptance.   Dental advisory given  Plan Discussed with: CRNA  Anesthesia Plan Comments:         Anesthesia Quick Evaluation

## 2018-07-14 NOTE — Telephone Encounter (Signed)
Patient did not have cardioversion today due to heart rate. Should he take Multaq today? Pleasee advise

## 2018-07-14 NOTE — Telephone Encounter (Signed)
Patient states that his cardioversion was not done today because he is in normal sinus rhythm. Patient reports he had only one episode of atrial fibrillation over the weekend. Patient was instructed to start multaq 400 mg twice daily after cardioversion today. Patient is asking if he needs to start this medication or not. Will have Dr. Bettina Gavia advise.

## 2018-07-16 ENCOUNTER — Ambulatory Visit (INDEPENDENT_AMBULATORY_CARE_PROVIDER_SITE_OTHER): Payer: Medicare Other | Admitting: Cardiology

## 2018-07-16 VITALS — Wt 192.0 lb

## 2018-07-16 DIAGNOSIS — I480 Paroxysmal atrial fibrillation: Secondary | ICD-10-CM

## 2018-07-16 NOTE — Progress Notes (Signed)
Patient is here for ekg after starting mutaq 400 mg by mouth daily, patient also reports some diarrhea he think may be related to his medications, and he reports he is not taking quinapril at this time due to being told to stop it before his cardioversion and he has never started back on it. He reports his blood pressure yesterday was 113/80 and 124/80 today. Dr. Agustin Cree advises he continue multaq, and doesn't start back quinapril at this time. No changes will be made per Dr. Agustin Cree to medications regarding diarrhea. Patient has been instructed to monitor blood pressures and let us know if they begin to increase. Patient verbally understands.

## 2018-07-16 NOTE — Patient Instructions (Signed)
Medication Instructions:  Your physician recommends that you continue on your current medications as directed. Please refer to the Current Medication list given to you today.  If you need a refill on your cardiac medications before your next appointment, please call your pharmacy.   Lab work: None.  If you have labs (blood work) drawn today and your tests are completely normal, you will receive your results only by: . MyChart Message (if you have MyChart) OR . A paper copy in the mail If you have any lab test that is abnormal or we need to change your treatment, we will call you to review the results.  Testing/Procedures: None.    Follow-Up: . Follow up as previously advised   Any Other Special Instructions Will Be Listed Below (If Applicable).    

## 2018-07-21 NOTE — Telephone Encounter (Signed)
done

## 2018-07-22 ENCOUNTER — Ambulatory Visit: Payer: Medicare Other

## 2018-07-23 DIAGNOSIS — R197 Diarrhea, unspecified: Secondary | ICD-10-CM | POA: Diagnosis not present

## 2018-07-24 ENCOUNTER — Telehealth: Payer: Self-pay | Admitting: Cardiology

## 2018-07-24 ENCOUNTER — Other Ambulatory Visit: Payer: Self-pay

## 2018-07-24 NOTE — Telephone Encounter (Signed)
Stop Multaq 

## 2018-07-24 NOTE — Telephone Encounter (Signed)
Patient was informed to stop the medication. Will keep a close watch on his pulse. Patient will call in 1 week with update.

## 2018-07-24 NOTE — Telephone Encounter (Signed)
Patient states that he started multaq on 11/26, per patient he believes the symptoms started prior to [facility] but has remained consistent since starting the med. Patient went to PCP yesterday and was told that PCP felt the multaq was the cause of the aching stomach and diarrhea. Please advise.

## 2018-07-24 NOTE — Telephone Encounter (Signed)
Patient needs a call as soon as possible. Patient having abdominal pain and diarrhea for two weeks. Please advise

## 2018-07-31 ENCOUNTER — Telehealth: Payer: Self-pay | Admitting: Cardiology

## 2018-07-31 NOTE — Telephone Encounter (Signed)
Dr Bettina Gavia is aware that the patient reports after one week of stopping Multaq his abdominal pain has stopped as well as diarrhea.    Patient advised per Dr Joya Gaskins verbal order that this will be discuss at follow up appointment on 08-14-18.  Patient advised to contact our office with any complications.  Patient agrees to plan and verbalized understanding.

## 2018-07-31 NOTE — Telephone Encounter (Signed)
Patient is reporting one week after Multaq, abdominal pain is gone, diarrhea is gone, and occasional afib.

## 2018-08-11 NOTE — Progress Notes (Signed)
Cardiology Office Note:    Date:  08/11/2018   ID:  Gregory Vasquez, DOB July 10, 1946, MRN [id number]  PCP:  Lawerance Cruel, MD  Cardiologist:  Shirlee More, MD    Referring MD: Lawerance Cruel, MD    ASSESSMENT:    1. PAF (paroxysmal atrial fibrillation) ([place])   2. Chronic anticoagulation   3. High risk medication use   4. Hypotension, unspecified hypotension type    PLAN:    In order of problems listed above:  1. Unfortunately intolerant of Multitak with abdominal pain and diarrhea and he continues to have episodes lasting up to a few hours of recurrent atrial fibrillation.  We will start flecainide 50 mg twice daily as antiarrhythmic drug therapy is a follow-up EKG early next week. 2. Stable continue his anticoagulant 3. Initiate flecainide 4. Resolved he will stay off his ACE inhibitor   Next appointment: 3 months   Medication Adjustments/Labs and Tests Ordered: Current medicines are reviewed at length with the patient today.  Concerns regarding medicines are outlined above.  No orders of the defined types were placed in this encounter.  No orders of the defined types were placed in this encounter.   No chief complaint on file.   History of Present Illness:    Gregory Vasquez is a 72 y.o. male with a hx of paroxysmal atrial fibrillation last seen 07/09/2018 when he had persistent atrial fibrillation for 5 days.  He was symptomatic we made arrangements for outpatient cardioversion I asked him to check his home monitor daily and to cancel the procedure if we resume sinus rhythm.  Despite those instructions he presented to the hospital in sinus rhythm and was sent home.  I given a prescription to start outpatient Multaq follow-up EKG in our office showed sinus rhythm and normal. Compliance with diet, lifestyle and medications: yes  Unfortunately intolerant of Multaq diarrhea abdominal pain and having recurrent documented PAF with his cardiac device lasting a few hours  several times a week.  Hypotension resolved home blood pressure runs 1 40-9 20 systolic off ACE inhibitor.  No angina syncope TIA or bleeding complication. Past Medical History:  Diagnosis Date  . Hypertension     Past Surgical History:  Procedure Laterality Date  . CHOLECYSTECTOMY    . TONSILLECTOMY      Current Medications: No outpatient medications have been marked as taking for the 08/14/18 encounter (Appointment) with Richardo Priest, MD.     Allergies:   Patient has no known allergies.   Social History   Socioeconomic History  . Marital status: Married    Spouse name: Not on file  . Number of children: Not on file  . Years of education: Not on file  . Highest education level: Not on file  Occupational History  . Not on file  Social Needs  . Financial resource strain: Not on file  . Food insecurity:    Worry: Not on file    Inability: Not on file  . Transportation needs:    Medical: Not on file    Non-medical: Not on file  Tobacco Use  . Smoking status: Former Smoker    Packs/day: 2.00    Years: 10.00    Pack years: 20.00    Types: Cigarettes    Last attempt to quit: 1976    Years since quitting: 44.0  . Smokeless tobacco: Never Used  Substance and Sexual Activity  . Alcohol use: Yes    Alcohol/week: 1.0 standard drinks  Types: 1 Cans of beer per week    Comment: 1 beer every 2 -3 days  . Drug use: Not Currently  . Sexual activity: Not on file  Lifestyle  . Physical activity:    Days per week: Not on file    Minutes per session: Not on file  . Stress: Not on file  Relationships  . Social connections:    Talks on phone: Not on file    Gets together: Not on file    Attends religious service: Not on file    Active member of club or organization: Not on file    Attends meetings of clubs or organizations: Not on file    Relationship status: Not on file  Other Topics Concern  . Not on file  Social History Narrative  . Not on file     Family  History: The patient's family history includes CAD in his mother; Heart attack in his father; Hyperlipidemia in his mother. ROS:   Please see the history of present illness.    All other systems reviewed and are negative.  EKGs/Labs/Other Studies Reviewed:    The following studies were reviewed today:  EKG 07/14/2018 sinus rhythm  Recent Labs: 03/12/2018: TSH 1.480 07/09/2018: BUN 12; Creatinine, Ser 1.24; Hemoglobin 13.6; Platelets 249; Potassium 4.9; Sodium 138  Recent Lipid Panel No results found for: CHOL, TRIG, HDL, CHOLHDL, VLDL, LDLCALC, LDLDIRECT  Physical Exam:    VS:  There were no vitals taken for this visit.    Wt Readings from Last 3 Encounters:  07/16/18 192 lb (87.1 kg)  07/09/18 193 lb 1.9 oz (87.6 kg)  05/06/18 195 lb 12.8 oz (88.8 kg)     GEN:  Well nourished, well developed in no acute distress HEENT: Normal NECK: No JVD; No carotid bruits LYMPHATICS: No lymphadenopathy CARDIAC: RRR, no murmurs, rubs, gallops RESPIRATORY:  Clear to auscultation without rales, wheezing or rhonchi  ABDOMEN: Soft, non-tender, non-distended MUSCULOSKELETAL:  No edema; No deformity  SKIN: Warm and dry NEUROLOGIC:  Alert and oriented x 3 PSYCHIATRIC:  Normal affect    Signed, Shirlee More, MD  08/11/2018 8:47 AM    [place]

## 2018-08-14 ENCOUNTER — Encounter: Payer: Self-pay | Admitting: Cardiology

## 2018-08-14 ENCOUNTER — Ambulatory Visit (INDEPENDENT_AMBULATORY_CARE_PROVIDER_SITE_OTHER): Payer: Medicare Other | Admitting: Cardiology

## 2018-08-14 VITALS — BP 118/72 | HR 81 | Ht 70.0 in | Wt 194.0 lb

## 2018-08-14 DIAGNOSIS — Z79899 Other long term (current) drug therapy: Secondary | ICD-10-CM

## 2018-08-14 DIAGNOSIS — Z7901 Long term (current) use of anticoagulants: Secondary | ICD-10-CM

## 2018-08-14 DIAGNOSIS — I480 Paroxysmal atrial fibrillation: Secondary | ICD-10-CM | POA: Diagnosis not present

## 2018-08-14 DIAGNOSIS — I959 Hypotension, unspecified: Secondary | ICD-10-CM

## 2018-08-14 MED ORDER — FLECAINIDE ACETATE 50 MG PO TABS: 50.0000 mg | ORAL_TABLET | Freq: Two times a day (BID) | ORAL | 3 refills | Status: DC

## 2018-08-14 NOTE — Patient Instructions (Addendum)
Medication Instructions:  Your physician has recommended you make the following change in your medication:   START flecainide (tambocor) 50 mg: Take 1 tablet twice daily  If you need a refill on your cardiac medications before your next appointment, please call your pharmacy.   Lab work: None  If you have labs (blood work) drawn today and your tests are completely normal, you will receive your results only by: Marland Kitchen MyChart Message (if you have MyChart) OR . A paper copy in the mail If you have any lab test that is abnormal or we need to change your treatment, we will call you to review the results.  Testing/Procedures: You will be scheduled for a nurse visit on Monday, 08/18/18 for an EKG.   Follow-Up: At [facility], you and your health needs are our priority.  As part of our continuing mission to provide you with exceptional heart care, we have created designated Provider Care Teams.  These Care Teams include your primary Cardiologist (physician) and Advanced Practice Providers (APPs -  Physician Assistants and Nurse Practitioners) who all work together to provide you with the care you need, when you need it. You will need a follow up appointment in 3 months.  Please call our office 2 months in advance to schedule this appointment.      Flecainide tablets What is this medicine? FLECAINIDE (FLEK a nide) is an antiarrhythmic drug. This medicine is used to prevent irregular heart rhythm. It can also slow down fast heartbeats called tachycardia. This medicine may be used for other purposes; ask your health care provider or pharmacist if you have questions. COMMON BRAND NAME(S): Tambocor What should I tell my health care provider before I take this medicine? They need to know if you have any of these conditions: -abnormal levels of potassium in the blood -heart disease including heart rhythm and heart rate problems -kidney or liver disease -recent heart attack -an unusual or allergic  reaction to flecainide, local anesthetics, other medicines, foods, dyes, or preservatives -pregnant or trying to get pregnant -breast-feeding How should I use this medicine? Take this medicine by mouth with a glass of water. Follow the directions on the prescription label. You can take this medicine with or without food. Take your doses at regular intervals. Do not take your medicine more often than directed. Do not stop taking this medicine suddenly. This may cause serious, heart-related side effects. If your doctor wants you to stop the medicine, the dose may be slowly lowered over time to avoid any side effects. Talk to your pediatrician regarding the use of this medicine in children. While this drug may be prescribed for children as young as 1 year of age for selected conditions, precautions do apply. Overdosage: If you think you have taken too much of this medicine contact a poison control center or emergency room at once. NOTE: This medicine is only for you. Do not share this medicine with others. What if I miss a dose? If you miss a dose, take it as soon as you can. If it is almost time for your next dose, take only that dose. Do not take double or extra doses. What may interact with this medicine? Do not take this medicine with any of the following medications: -amoxapine -arsenic trioxide -certain antibiotics like clarithromycin, erythromycin, gatifloxacin, gemifloxacin, levofloxacin, moxifloxacin, sparfloxacin, or troleandomycin -certain antidepressants called tricyclic antidepressants like amitriptyline, imipramine, or nortriptyline -certain medicines to control heart rhythm like disopyramide, dofetilide, encainide, moricizine, procainamide, propafenone, and quinidine -cisapride -  cyclobenzaprine -delavirdine -droperidol -haloperidol -hawthorn -imatinib -levomethadyl -maprotiline -medicines for malaria like chloroquine and halofantrine -pentamidine -phenothiazines like  chlorpromazine, mesoridazine, prochlorperazine, thioridazine -pimozide -quinine -ranolazine -ritonavir -sertindole -ziprasidone This medicine may also interact with the following medications: -cimetidine -medicines for angina or high blood pressure -medicines to control heart rhythm like amiodarone and digoxin This list may not describe all possible interactions. Give your health care provider a list of all the medicines, herbs, non-prescription drugs, or dietary supplements you use. Also tell them if you smoke, drink alcohol, or use illegal drugs. Some items may interact with your medicine. What should I watch for while using this medicine? Visit your doctor or health care professional for regular checks on your progress. Because your condition and the use of this medicine carries some risk, it is a good idea to carry an identification card, necklace or bracelet with details of your condition, medications and doctor or health care professional. Check your blood pressure and pulse rate regularly. Ask your health care professional what your blood pressure and pulse rate should be, and when you should contact him or her. Your doctor or health care professional also may schedule regular blood tests and electrocardiograms to check your progress. You may get drowsy or dizzy. Do not drive, use machinery, or do anything that needs mental alertness until you know how this medicine affects you. Do not stand or sit up quickly, especially if you are an older patient. This reduces the risk of dizzy or fainting spells. Alcohol can make you more dizzy, increase flushing and rapid heartbeats. Avoid alcoholic drinks. What side effects may I notice from receiving this medicine? Side effects that you should report to your doctor or health care professional as soon as possible: -chest pain, continued irregular heartbeats -difficulty breathing -swelling of the legs or feet -trembling, shaking -unusually weak or  tired Side effects that usually do not require medical attention (report to your doctor or health care professional if they continue or are bothersome): -blurred vision -constipation -headache -nausea, vomiting -stomach pain This list may not describe all possible side effects. Call your doctor for medical advice about side effects. You may report side effects to FDA at 1-800-FDA-1088. Where should I keep my medicine? Keep out of the reach of children. Store at room temperature between 15 and 30 degrees C (59 and 86 degrees F). Protect from light. Keep container tightly closed. Throw away any unused medicine after the expiration date. NOTE: This sheet is a summary. It may not cover all possible information. If you have questions about this medicine, talk to your doctor, pharmacist, or health care provider.  2019 Elsevier/Gold Standard (2007-12-10 16:46:09)

## 2018-08-18 ENCOUNTER — Ambulatory Visit (INDEPENDENT_AMBULATORY_CARE_PROVIDER_SITE_OTHER): Payer: Medicare Other | Admitting: Cardiology

## 2018-08-18 ENCOUNTER — Other Ambulatory Visit: Payer: Self-pay | Admitting: Cardiology

## 2018-08-18 DIAGNOSIS — I480 Paroxysmal atrial fibrillation: Secondary | ICD-10-CM | POA: Diagnosis not present

## 2018-08-18 DIAGNOSIS — I4892 Unspecified atrial flutter: Secondary | ICD-10-CM

## 2018-08-18 MED ORDER — METOPROLOL SUCCINATE ER 25 MG PO TB24: 25.0000 mg | ORAL_TABLET | Freq: Two times a day (BID) | ORAL | 3 refills | Status: DC

## 2018-08-18 NOTE — Patient Instructions (Signed)
Medication Instructions:  Your physician has recommended you make the following change in your medication:   STOP flecainide  INCREASE metoprolol succinate (toprol-XL) 25 mg: Take 1 tablet twice daily  If you need a refill on your cardiac medications before your next appointment, please call your pharmacy.   Lab work: None  If you have labs (blood work) drawn today and your tests are completely normal, you will receive your results only by: Marland Kitchen MyChart Message (if you have MyChart) OR . A paper copy in the mail If you have any lab test that is abnormal or we need to change your treatment, we will call you to review the results.  Testing/Procedures: You had an EKG today.  You have been referred to see an electrophysiologist, Dr. Curt Bears. You will be contacted to schedule this appointment.   Follow-Up: At [facility], you and your health needs are our priority.  As part of our continuing mission to provide you with exceptional heart care, we have created designated Provider Care Teams.  These Care Teams include your primary Cardiologist (physician) and Advanced Practice Providers (APPs -  Physician Assistants and Nurse Practitioners) who all work together to provide you with the care you need, when you need it. You will need a follow up appointment in 3 months.  Please call our office 2 months in advance to schedule this appointment.

## 2018-08-18 NOTE — Progress Notes (Signed)
Patient presents to our office for an EKG after starting flecainide 50 mg twice daily on Thursday, 08/14/18. EKG was done and reviewed by Dr. Bettina Gavia who advised patient is now in atrial flutter with a rate of 156. Dr. Bettina Gavia recommends patient stop flecainide and increase dose of metoprolol succinate 25 mg from once daily to twice daily. A referral to electrophysiology has been placed and a message has been sent to EP scheduling to set patient up with Dr. Curt Bears. Patient will follow up with Dr. Bettina Gavia as scheduled in 3 months. Patient verbalized understanding. No further questions.   Patient will contact our office if heart rate remains elevated after medication changes are made.

## 2018-08-28 ENCOUNTER — Encounter: Payer: Self-pay | Admitting: *Deleted

## 2018-09-09 ENCOUNTER — Encounter: Payer: Self-pay | Admitting: Cardiology

## 2018-09-09 ENCOUNTER — Ambulatory Visit: Payer: Medicare Other | Admitting: Cardiology

## 2018-09-09 VITALS — BP 122/76 | HR 74 | Ht 70.0 in | Wt 191.0 lb

## 2018-09-09 DIAGNOSIS — I480 Paroxysmal atrial fibrillation: Secondary | ICD-10-CM | POA: Diagnosis not present

## 2018-09-09 DIAGNOSIS — I10 Essential (primary) hypertension: Secondary | ICD-10-CM | POA: Diagnosis not present

## 2018-09-09 DIAGNOSIS — I483 Typical atrial flutter: Secondary | ICD-10-CM | POA: Diagnosis not present

## 2018-09-09 NOTE — Patient Instructions (Signed)
Medication Instructions:    Your physician recommends that you continue on your current medications as directed. Please refer to the Current Medication list given to you today.  - If you need a refill on your cardiac medications before your next appointment, please call your pharmacy.   Labwork:  None ordered  Testing/Procedures:  None ordered  Follow-Up:  Your physician recommends that you schedule a follow-up appointment in: 3 months with Dr. Camnitz.  Thank you for choosing [facility]!!   Damon Baisch, RN [phone]         

## 2018-09-09 NOTE — Progress Notes (Signed)
Electrophysiology Office Note   Date:  09/09/2018   ID:  Gregory, Vasquez 29-Dec-1945, MRN [id number]  PCP:  Lawerance Cruel, MD  Cardiologist:  [facility] Primary Electrophysiologist:  Dr Curt Bears    CC: Evaluation for atrial fibrillation/atrial flutter   History of Present Illness: Gregory Vasquez is a 73 y.o. male who is being seen today for the evaluation of atrial fibrillation at the request of Gregory Gavia Hilton Cork, MD. Presenting today for electrophysiology evaluation.  He has a history of paroxymal atrial fibrillation and typical atrial flutter.  He was previously intolerant of Multaq 2/2 GI side effects.  He was started on flecainide but he had several episodes of rapid atrial flutter and this was discontinued as well. At his last visit with Dr Gregory Gavia, his BB was increased and he has felt better overall. He has symptomatic episodes of atrial fibrillation with fatigue and palpitations about once every 2 weeks which last for several hours. He denies snoring or daytime somnolence or significant alcohol use.  Today, he denies symptoms of chest pain, shortness of breath, orthopnea, PND, lower extremity edema, claudication, dizziness, presyncope, syncope, bleeding, or neurologic sequela. The patient is tolerating medications without difficulties.    Past Medical History:  Diagnosis Date  . Hypertension    Past Surgical History:  Procedure Laterality Date  . CHOLECYSTECTOMY    . TONSILLECTOMY       Current Outpatient Medications  Medication Sig Dispense Refill  . ELIQUIS 5 MG TABS tablet TAKE 1 TABLET BY MOUTH TWICE DAILY 60 tablet 3  . metoprolol succinate (TOPROL-XL) 25 MG 24 hr tablet Take 1 tablet (25 mg total) by mouth 2 (two) times daily. 60 tablet 3  . Multiple Vitamin (MULTIVITAMIN WITH MINERALS) TABS tablet Take 1 tablet by mouth daily. Adult Multivitamin Centrum 50+    . Omega-3 Fatty Acids (FISH OIL ULTRA) 1400 MG CAPS Take 1,400 mg by mouth daily.    Marland Kitchen omeprazole (PRILOSEC  OTC) 20 MG tablet Take 10 mg by mouth daily before breakfast.    . terbinafine (LAMISIL) 250 MG tablet Take 250 mg by mouth See admin instructions. Take 1 tablet (250 mg) by mouth 1 week out of each month, then off for 3 weeks.  2   No current facility-administered medications for this visit.     Allergies:   Multaq [dronedarone]   Social History:  The patient  reports that he quit smoking about 44 years ago. His smoking use included cigarettes. He has a 20.00 pack-year smoking history. He has never used smokeless tobacco. He reports current alcohol use of about 1.0 standard drinks of alcohol per week. He reports previous drug use.   Family History:  The patient's family history includes CAD in his mother; Heart attack in his father; Hyperlipidemia in his mother.    ROS:  Please see the history of present illness.   Otherwise, review of systems is positive for diarrhea, palpitations.   All other systems are reviewed and negative.    PHYSICAL EXAM: VS:  BP 122/76   Pulse 74   Ht 5\' 10"  (1.778 m)   Wt 191 lb (86.6 kg)   BMI 27.41 kg/m  , BMI Body mass index is 27.41 kg/m. GEN: Well nourished, well developed, in no acute distress  HEENT: normal  Neck: no JVD, carotid bruits, or masses Cardiac: RRR; no murmurs, rubs, or gallops,no edema  Respiratory:  clear to auscultation bilaterally, normal work of breathing GI: soft, nontender, nondistended, + BS MS: no  deformity or atrophy  Skin: warm and dry Neuro:  Strength and sensation are intact Psych: euthymic mood, full affect  EKG:  EKG is ordered today. Personal review of the ekg ordered shows SR HR 74, PR 142, QRS 76, QTc 410  Recent Labs: 03/12/2018: TSH 1.480 07/09/2018: BUN 12; Creatinine, Ser 1.24; Hemoglobin 13.6; Platelets 249; Potassium 4.9; Sodium 138    Lipid Panel  No results found for: CHOL, TRIG, HDL, CHOLHDL, VLDL, LDLCALC, LDLDIRECT   Wt Readings from Last 3 Encounters:  09/09/18 191 lb (86.6 kg)  08/14/18 194  lb (88 kg)  07/16/18 192 lb (87.1 kg)      Other studies Reviewed: Additional studies/ records that were reviewed today include: TTE 05/12/18  Review of the above records today demonstrates:  - Normal LVEF.   Normal LA size.   Trace TR, MR.   ASSESSMENT AND PLAN:  1.  Paroxysmal atrial fibrillation/atrial flutter: Currently on metoprolol and Eliquis. Previously tried Multaq and flecainide. We discussed various therapeutic options including Tikosyn, sotalol, amiodarone, and afib/flutter ablation. At this point, he is feeling well on his increased metoprolol dose and would prefer to not change therapy for now. Continue Toprol 25 mg BID Continue Eliquis 5 mg BID  This patients CHA2DS2-VASc Score and unadjusted Ischemic Stroke Rate (% per year) is equal to 2.2 % stroke rate/year from a score of 2  Above score calculated as 1 point each if present [CHF, HTN, DM, Vascular=MI/PAD/Aortic Plaque, Age if 65-74, or Male] Above score calculated as 2 points each if present [Age > 75, or Stroke/TIA/TE]  2. HTN: Stable, no changes today   Current medicines are reviewed at length with the patient today.   The patient does not have concerns regarding his medicines.  The following changes were made today:  none  Labs/ tests ordered today include:  Orders Placed This Encounter  Procedures  . EKG 12-Lead     Disposition:   FU with Kenrick Pore 3 months  Signed, Lennox Leikam Meredith Leeds, MD  09/09/2018 10:58 AM     [facility] [address] [place] [address] [phone] (office) [phone] (fax)  I have seen and examined this patient with Adline Peals.  Agree with above, note added to reflect my findings.  On exam, RRR, no murmurs, lungs clear.  With atrial fibrillation.  He is tried multiple medications including Multitak and flecainide which he did not tolerate.  His metoprolol was recently increased which is greatly improved his AF symptoms.  I Suriya Kovarik make  no changes at this time.  I Deborahann Poteat follow-up in 3 months for further discussions of therapy.  Case discussed with referring cardiologist.  Porcia Morganti M. Jaira Canady MD 09/09/2018 10:58 AM

## 2018-11-07 ENCOUNTER — Ambulatory Visit: Payer: Medicare Other | Admitting: Cardiology

## 2018-12-02 ENCOUNTER — Telehealth: Payer: Self-pay | Admitting: *Deleted

## 2018-12-02 NOTE — Telephone Encounter (Signed)
Virtual Visit Pre-Appointment Phone Call  Steps For Call:  Confirm consent - "In the setting of the current Covid19 crisis, you are scheduled for a (phone or video) visit with your provider on (date) at (time).  Just as we do with many in-office visits, in order for you to participate in this visit, we must obtain consent.  If you'd like, I can send this to your mychart (if signed up) or email for you to review.  Otherwise, I can obtain your verbal consent now.  All virtual visits are billed to your insurance company just like a normal visit would be.  By agreeing to a virtual visit, we'd like you to understand that the technology does not allow for your provider to perform an examination, and thus may limit your provider's ability to fully assess your condition.  Finally, though the technology is pretty good, we cannot assure that it will always work on either your or our end, and in the setting of a video visit, we may have to convert it to a phone-only visit.  In either situation, we cannot ensure that we have a secure connection.  Are you willing to proceed?" YES  1. Give patient instructions for WebEx/MyChart download to smartphone or Doximity/Doxy.me if video visit (depending on what platform provider is using)  2. Advise patient to be prepared with any vital sign or heart rhythm information, their current medicines, and a piece of paper and pen handy for any instructions they may receive the day of their visit  3. Inform patient they will receive a phone call 15 minutes prior to their appointment time (may be from unknown caller ID) so they should be prepared to answer  4. Confirm that appointment type is correct in Epic appointment notes (video vs telephone)    TELEPHONE CALL NOTE  Lazer Wollard has been deemed a candidate for a follow-up tele-health visit to limit community exposure during the Covid-19 pandemic. I spoke with the patient via phone to ensure availability of phone/video  source, confirm preferred email & phone number, and discuss instructions and expectations.  I reminded Braylen Staller to be prepared with any vital sign and/or heart rhythm information that could potentially be obtained via home monitoring, at the time of his visit. I reminded Aziel Morgan to expect a phone call at the time of his visit if his visit.  Seraiah Nowack 12/02/2018 10:28 AM   CONSENT FOR TELE-HEALTH VISIT - PLEASE REVIEW  I hereby voluntarily request, consent and authorize [facility] and its employed or contracted physicians, physician assistants, nurse practitioners or other licensed health care professionals (the Practitioner), to provide me with telemedicine health care services (the Services") as deemed necessary by the treating Practitioner. I acknowledge and consent to receive the Services by the Practitioner via telemedicine. I understand that the telemedicine visit will involve communicating with the Practitioner through live audiovisual communication technology and the disclosure of certain medical information by electronic transmission. I acknowledge that I have been given the opportunity to request an in-person assessment or other available alternative prior to the telemedicine visit and am voluntarily participating in the telemedicine visit.  I understand that I have the right to withhold or withdraw my consent to the use of telemedicine in the course of my care at any time, without affecting my right to future care or treatment, and that the Practitioner or I may terminate the telemedicine visit at any time. I understand that I have the right to inspect all  information obtained and/or recorded in the course of the telemedicine visit and may receive copies of available information for a reasonable fee.  I understand that some of the potential risks of receiving the Services via telemedicine include:   Delay or interruption in medical evaluation due to technological equipment  failure or disruption;  Information transmitted may not be sufficient (e.g. poor resolution of images) to allow for appropriate medical decision making by the Practitioner; and/or   In rare instances, security protocols could fail, causing a breach of personal health information.  Furthermore, I acknowledge that it is my responsibility to provide information about my medical history, conditions and care that is complete and accurate to the best of my ability. I acknowledge that Practitioner's advice, recommendations, and/or decision may be based on factors not within their control, such as incomplete or inaccurate data provided by me or distortions of diagnostic images or specimens that may result from electronic transmissions. I understand that the practice of medicine is not an exact science and that Practitioner makes no warranties or guarantees regarding treatment outcomes. I acknowledge that I will receive a copy of this consent concurrently upon execution via email to the email address I last provided but may also request a printed copy by calling the office of [place].    I understand that my insurance will be billed for this visit.   I have read or had this consent read to me.  I understand the contents of this consent, which adequately explains the benefits and risks of the Services being provided via telemedicine.   I have been provided ample opportunity to ask questions regarding this consent and the Services and have had my questions answered to my satisfaction.  I give my informed consent for the services to be provided through the use of telemedicine in my medical care  By participating in this telemedicine visit I agree to the above. VERBALLY AGREED

## 2018-12-09 ENCOUNTER — Telehealth (INDEPENDENT_AMBULATORY_CARE_PROVIDER_SITE_OTHER): Payer: Medicare Other | Admitting: Cardiology

## 2018-12-09 ENCOUNTER — Other Ambulatory Visit: Payer: Self-pay

## 2018-12-09 ENCOUNTER — Ambulatory Visit: Payer: Medicare Other | Admitting: Cardiology

## 2018-12-09 ENCOUNTER — Encounter: Payer: Self-pay | Admitting: Cardiology

## 2018-12-09 DIAGNOSIS — I10 Essential (primary) hypertension: Secondary | ICD-10-CM | POA: Diagnosis not present

## 2018-12-09 DIAGNOSIS — I480 Paroxysmal atrial fibrillation: Secondary | ICD-10-CM

## 2018-12-09 MED ORDER — AMIODARONE HCL 200 MG PO TABS: 200.0000 mg | ORAL_TABLET | Freq: Two times a day (BID) | ORAL | 3 refills | Status: DC

## 2018-12-09 NOTE — Addendum Note (Signed)
Addended by: Stanton Kidney on: 12/09/2018 10:17 AM   Modules accepted: Orders

## 2018-12-09 NOTE — Progress Notes (Signed)
Electrophysiology TeleHealth Note   Due to national recommendations of social distancing due to COVID 19, an audio/video telehealth visit is felt to be most appropriate for this patient at this time.  See Epic message for the patient's consent to telehealth for [facility].   Date:  12/09/2018   ID:  Gregory Vasquez, DOB [id number], MRN [id number]  Location: patient's home  Provider location: [address] [place]  Evaluation Performed: Follow-up visit  PCP:  Lawerance Cruel, MD  Cardiologist:  [facility] Electrophysiologist:  Dr Curt Bears  Chief Complaint:  AF  History of Present Illness:    Gregory Vasquez is a 73 y.o. male who presents via audio/video conferencing for a telehealth visit today.  Since last being seen in our clinic, the patient reports doing very well.  Today, he denies symptoms of palpitations, chest pain, shortness of breath,  lower extremity edema, dizziness, presyncope, or syncope.  The patient is otherwise without complaint today.  The patient denies symptoms of fevers, chills, cough, or new SOB worrisome for COVID 19.   Today, denies symptoms of palpitations, chest pain, shortness of breath, orthopnea, PND, lower extremity edema, claudication, dizziness, presyncope, syncope, bleeding, or neurologic sequela. The patient is tolerating medications without difficulties.  Unfortunately, he continues to have episodes of atrial fibrillation.  Last Tuesday he went into atrial fibrillation and did not come out of it until Saturday.  He says that his heart rates at times were in the 160s.  He also notes that his blood pressures were in the 95G systolic.  He felt weak and fatigued.  At this point, he does not wish to try any further medication options and would prefer to have ablation for his atrial fibrillation.  Past Medical History:  Diagnosis Date  . Hypertension     Past Surgical History:  Procedure Laterality Date  . CHOLECYSTECTOMY    .  TONSILLECTOMY      Current Outpatient Medications  Medication Sig Dispense Refill  . ELIQUIS 5 MG TABS tablet TAKE 1 TABLET BY MOUTH TWICE DAILY 60 tablet 3  . metoprolol succinate (TOPROL-XL) 25 MG 24 hr tablet Take 1 tablet (25 mg total) by mouth 2 (two) times daily. 60 tablet 3  . Multiple Vitamin (MULTIVITAMIN WITH MINERALS) TABS tablet Take 1 tablet by mouth daily. Adult Multivitamin Centrum 50+    . Omega-3 Fatty Acids (FISH OIL ULTRA) 1400 MG CAPS Take 1,400 mg by mouth daily.    Marland Kitchen omeprazole (PRILOSEC OTC) 20 MG tablet Take 10 mg by mouth daily before breakfast.    . terbinafine (LAMISIL) 250 MG tablet Take 250 mg by mouth See admin instructions. Take 1 tablet (250 mg) by mouth 1 week out of each month, then off for 3 weeks.  2   No current facility-administered medications for this visit.     Allergies:   Multaq [dronedarone]   Social History:  The patient  reports that he quit smoking about 44 years ago. His smoking use included cigarettes. He has a 20.00 pack-year smoking history. He has never used smokeless tobacco. He reports current alcohol use of about 1.0 standard drinks of alcohol per week. He reports previous drug use.   Family History:  The patient's  family history includes CAD in his mother; Heart attack in his father; Hyperlipidemia in his mother.   ROS:  Please see the history of present illness.   All other systems are personally reviewed and negative.    Exam:  Vital Signs:  BP 110/70   Pulse 72   Well appearing, alert and conversant, regular work of breathing,  good skin color Eyes- anicteric, neuro- grossly intact, skin- no apparent rash or lesions or cyanosis, mouth- oral mucosa is pink   Labs/Other Tests and Data Reviewed:    Recent Labs: 03/12/2018: TSH 1.480 07/09/2018: BUN 12; Creatinine, Ser 1.24; Hemoglobin 13.6; Platelets 249; Potassium 4.9; Sodium 138   Wt Readings from Last 3 Encounters:  09/09/18 191 lb (86.6 kg)  08/14/18 194 lb (88 kg)   07/16/18 192 lb (87.1 kg)     Other studies personally reviewed: Additional studies/ records that were reviewed today include: ECG 09/09/2018 personally reviewed Review of the above records today demonstrates: Sinus rhythm    ASSESSMENT & PLAN:    1.  Paroxysmal atrial fibrillation/atrial flutter: Currently on metoprolol and Eliquis.  Is previously tried flecainide and Multitak.  He continues to have episodes of atrial fibrillation.  His atrial fibrillation makes him feel weak and fatigued.  Due to that, he would prefer to have an ablation.  Risks and benefits were discussed include bleeding, tamponade, heart block, stroke, damage to surrounding organs.  Patient understands these risks and is agreed to the procedure.  In an effort to get him through the coronavirus pandemic, we  start him on amiodarone 200 mg twice a day for 1 month followed by 200 mg a day.  Should he have trouble with side effects, I have asked him to stop the metoprolol first, then we  decrease the amiodarone to 200 mg a day.  This patients CHA2DS2-VASc Score and unadjusted Ischemic Stroke Rate (% per year) is equal to 2.2 % stroke rate/year from a score of 2  Above score calculated as 1 point each if present [CHF, HTN, DM, Vascular=MI/PAD/Aortic Plaque, Age if 65-74, or Male] Above score calculated as 2 points each if present [Age > 75, or Stroke/TIA/TE]  2.  Hypertension: Blood pressure has been well controlled.  No changes.   COVID 19 screen The patient denies symptoms of COVID 19 at this time.  The importance of social distancing was discussed today.  Follow-up: 3 months   Current medicines are reviewed at length with the patient today.   The patient does not have concerns regarding his medicines.  The following changes were made today:  none  Labs/ tests ordered today include:  No orders of the defined types were placed in this encounter.  Case discussed with primary cardiology  Patient Risk:   after full review of this patients clinical status, I feel that they are at moderate risk at this time.  Today, I have spent 15 minutes with the patient with telehealth technology discussing atrial fibrillation.    Signed,  Meredith Leeds, MD  12/09/2018 9:55 AM     [facility] [address] [place] [address] [phone] (office) [phone] (fax)

## 2018-12-10 ENCOUNTER — Telehealth: Payer: Self-pay | Admitting: Cardiology

## 2018-12-10 NOTE — Telephone Encounter (Signed)
Patient called stating Dr Curt Bears put him on Amiodarone yesterday.  After getting the medication and reading the patient pamphlet, he is not comfortable taking it so he wanted Dr Joya Gaskins advise and opinion.  The pamphlet says he will start this medication while in the hospital where he can be observed and he is not currently in the hospital and the side effects "are a little scary".  Please call patient to discuss

## 2018-12-10 NOTE — Telephone Encounter (Signed)
I know that there is pamphlets that come from the pharmacy could be quite intimidating.  The most common reason that we use amiodarone is for atrial fibrillation we use it in small doses in the standard is started as an outpatient but they are referencing in the handout as the patient is in the hospital received loading with intravenous and loading doses of oral amiodarone.  The drug had does have the potential for multiple side effects but they are usually related to her duration and dosage and we monitor patient's closely further liver function thyroid and personally I do a chest x-ray every year.  I think for him amiodarone is a good choice and again this is the most commonly used indication and I am comfortable starting it as an outpatient.

## 2018-12-10 NOTE — Telephone Encounter (Signed)
Patient is to start on amiodarone per Dr. Curt Bears but is reading the product literature and is very concerned about the side effects. What he's reading also says medication should be started in the hospital so the patient can be closely observed and he's not in the hospital. He wants Dr. Joya Gaskins opinion on whether its really OK.  pls advise, tx

## 2018-12-11 ENCOUNTER — Telehealth: Payer: Self-pay

## 2018-12-11 NOTE — Telephone Encounter (Signed)
(  cont) ...concerns with amiodarone side effects and not wanting to take it. Read Dr. Joya Gaskins response stating that medication started at home is a smaller dose than that in the hospital which doesn't require the same type of monitoring. Informed that routine bloodwork and chest xrays will be performed per protocol to make sure all values are within normal range. Patient agrees to this and OK with starting therapy.

## 2018-12-11 NOTE — Telephone Encounter (Signed)
Called patient and informed of Dr. Joya Gaskins answers to his concerns about dangerous side effects of amiodarone therapy and not wanting to take it.

## 2018-12-18 ENCOUNTER — Other Ambulatory Visit: Payer: Self-pay | Admitting: Cardiology

## 2018-12-18 MED ORDER — APIXABAN 5 MG PO TABS: 5.0000 mg | ORAL_TABLET | Freq: Two times a day (BID) | ORAL | 1 refills | Status: DC

## 2018-12-18 MED ORDER — METOPROLOL SUCCINATE ER 25 MG PO TB24: 25.0000 mg | ORAL_TABLET | Freq: Two times a day (BID) | ORAL | 1 refills | Status: DC

## 2018-12-18 NOTE — Telephone Encounter (Signed)
Rx for Eliquis and Metoprolol sent to [facility] as requested.

## 2018-12-18 NOTE — Telephone Encounter (Signed)
°*  STAT* If patient is at the pharmacy, call can be transferred to refill team.   1. Which medications need to be refilled? (please list name of each medication and dose if known) ELIQUIS 5 MG TABS tablet      metoprolol succinate (TOPROL-XL) 25 MG 24 hr tablet    2. Which pharmacy/location (including street and city if local pharmacy) is medication to be sent to?  [facility], [place] - [address] [phone] (Phone) [phone] (Fax)    3. Do they need a 30 day or 90 day supply? 90 day

## 2018-12-31 ENCOUNTER — Telehealth: Payer: Self-pay | Admitting: Cardiology

## 2018-12-31 NOTE — Telephone Encounter (Signed)
Virtual Visit Pre-Appointment Phone Call  "(Name), I am calling you today to discuss your upcoming appointment. We are currently trying to limit exposure to the virus that causes COVID-19 by seeing patients at home rather than in the office."  1. "What is the BEST phone number to call the day of the visit?" - include this in appointment notes  2. Do you have or have access to (through a family member/friend) a smartphone with video capability that we can use for your visit?" a. If yes - list this number in appt notes as cell (if different from BEST phone #) and list the appointment type as a VIDEO visit in appointment notes b. If no - list the appointment type as a PHONE visit in appointment notes  3. Confirm consent - "In the setting of the current Covid19 crisis, you are scheduled for a (phone or video) visit with your provider on (date) at (time).  Just as we do with many in-office visits, in order for you to participate in this visit, we must obtain consent.  If you'd like, I can send this to your mychart (if signed up) or email for you to review.  Otherwise, I can obtain your verbal consent now.  All virtual visits are billed to your insurance company just like a normal visit would be.  By agreeing to a virtual visit, we'd like you to understand that the technology does not allow for your provider to perform an examination, and thus may limit your provider's ability to fully assess your condition. If your provider identifies any concerns that need to be evaluated in person, we will make arrangements to do so.  Finally, though the technology is pretty good, we cannot assure that it will always work on either your or our end, and in the setting of a video visit, we may have to convert it to a phone-only visit.  In either situation, we cannot ensure that we have a secure connection.  Are you willing to proceed?" STAFF: Did the patient verbally acknowledge consent to telehealth visit? Document  YES/NO here: Yes  4. Advise patient to be prepared - "Two hours prior to your appointment, go ahead and check your blood pressure, pulse, oxygen saturation, and your weight (if you have the equipment to check those) and write them all down. When your visit starts, your provider will ask you for this information. If you have an Apple Watch or Kardia device, please plan to have heart rate information ready on the day of your appointment. Please have a pen and paper handy nearby the day of the visit as well."  5. Give patient instructions for MyChart download to smartphone OR Doximity/Doxy.me as below if video visit (depending on what platform provider is using)  6. Inform patient they will receive a phone call 15 minutes prior to their appointment time (may be from unknown caller ID) so they should be prepared to answer    TELEPHONE CALL NOTE  Derrik Mceachern has been deemed a candidate for a follow-up tele-health visit to limit community exposure during the Covid-19 pandemic. I spoke with the patient via phone to ensure availability of phone/video source, confirm preferred email & phone number, and discuss instructions and expectations.  I reminded Gregory Vasquez to be prepared with any vital sign and/or heart rhythm information that could potentially be obtained via home monitoring, at the time of his visit. I reminded Kipp Shank to expect a phone call prior to his visit.  Calla Kicks 12/31/2018 4:13 PM   INSTRUCTIONS FOR DOWNLOADING THE MYCHART APP TO SMARTPHONE  - The patient must first make sure to have activated MyChart and know their login information - If Apple, go to [employer] and type in MyChart in the search bar and download the app. If Android, ask patient to go to [employer] and type in [place] in the search bar and download the app. The app is free but as with any other app downloads, their phone may require them to verify saved payment information or Apple/Android  password.  - The patient will need to then log into the app with their MyChart username and password, and select [facility] as their healthcare provider to link the account. When it is time for your visit, go to the MyChart app, find appointments, and click Begin Video Visit. Be sure to Select Allow for your device to access the Microphone and Camera for your visit. You will then be connected, and your provider will be with you shortly.  **If they have any issues connecting, or need assistance please contact MyChart service desk [phone])**  **If using a computer, in order to ensure the best quality for their visit they will need to use either of the following Internet Browsers: [employer], or Google Chrome**  IF USING DOXIMITY or DOXY.ME - The patient will receive a link just prior to their visit by text.     FULL LENGTH CONSENT FOR TELE-HEALTH VISIT   I hereby voluntarily request, consent and authorize [facility] and its employed or contracted physicians, physician assistants, nurse practitioners or other licensed health care professionals (the Practitioner), to provide me with telemedicine health care services (the Services") as deemed necessary by the treating Practitioner. I acknowledge and consent to receive the Services by the Practitioner via telemedicine. I understand that the telemedicine visit will involve communicating with the Practitioner through live audiovisual communication technology and the disclosure of certain medical information by electronic transmission. I acknowledge that I have been given the opportunity to request an in-person assessment or other available alternative prior to the telemedicine visit and am voluntarily participating in the telemedicine visit.  I understand that I have the right to withhold or withdraw my consent to the use of telemedicine in the course of my care at any time, without affecting my right to future care or treatment,  and that the Practitioner or I may terminate the telemedicine visit at any time. I understand that I have the right to inspect all information obtained and/or recorded in the course of the telemedicine visit and may receive copies of available information for a reasonable fee.  I understand that some of the potential risks of receiving the Services via telemedicine include:   Delay or interruption in medical evaluation due to technological equipment failure or disruption;  Information transmitted may not be sufficient (e.g. poor resolution of images) to allow for appropriate medical decision making by the Practitioner; and/or   In rare instances, security protocols could fail, causing a breach of personal health information.  Furthermore, I acknowledge that it is my responsibility to provide information about my medical history, conditions and care that is complete and accurate to the best of my ability. I acknowledge that Practitioner's advice, recommendations, and/or decision may be based on factors not within their control, such as incomplete or inaccurate data provided by me or distortions of diagnostic images or specimens that may result from electronic transmissions. I understand that the  practice of medicine is not an Chief Strategy Officer and that Practitioner makes no warranties or guarantees regarding treatment outcomes. I acknowledge that I will receive a copy of this consent concurrently upon execution via email to the email address I last provided but may also request a printed copy by calling the office of [place].    I understand that my insurance will be billed for this visit.   I have read or had this consent read to me.  I understand the contents of this consent, which adequately explains the benefits and risks of the Services being provided via telemedicine.   I have been provided ample opportunity to ask questions regarding this consent and the Services and have had my questions  answered to my satisfaction.  I give my informed consent for the services to be provided through the use of telemedicine in my medical care  By participating in this telemedicine visit I agree to the above.

## 2019-01-05 ENCOUNTER — Telehealth: Payer: Self-pay | Admitting: *Deleted

## 2019-01-05 ENCOUNTER — Telehealth (INDEPENDENT_AMBULATORY_CARE_PROVIDER_SITE_OTHER): Payer: Medicare Other | Admitting: Cardiology

## 2019-01-05 ENCOUNTER — Other Ambulatory Visit: Payer: Self-pay

## 2019-01-05 ENCOUNTER — Encounter: Payer: Self-pay | Admitting: Cardiology

## 2019-01-05 VITALS — BP 114/68 | HR 50 | Ht 70.0 in | Wt 182.0 lb

## 2019-01-05 DIAGNOSIS — I480 Paroxysmal atrial fibrillation: Secondary | ICD-10-CM

## 2019-01-05 DIAGNOSIS — Z7901 Long term (current) use of anticoagulants: Secondary | ICD-10-CM

## 2019-01-05 DIAGNOSIS — I10 Essential (primary) hypertension: Secondary | ICD-10-CM

## 2019-01-05 DIAGNOSIS — Z79899 Other long term (current) drug therapy: Secondary | ICD-10-CM

## 2019-01-05 NOTE — Patient Instructions (Addendum)
Medication Instructions: . If you need a refill on your cardiac medications before your next appointment, please call your pharmacy.  DECREASE: Metoprolol succinate XL 25 mg (1 tab) daily   Lab work:Your physician recommends that you return for lab work in:  2-3 days CMP,TSH   If you have labs (blood work) drawn today and your tests are completely normal, you will receive your results only by: Marland Kitchen MyChart Message (if you have MyChart) OR . A paper copy in the mail If you have any lab test that is abnormal or we need to change your treatment, we will call you to review the results.  Testing/Procedures: NOne  Follow-Up: At [facility], you and your health needs are our priority.  As part of our continuing mission to provide you with exceptional heart care, we have created designated Provider Care Teams.  These Care Teams include your primary Cardiologist (physician) and Advanced Practice Providers (APPs -  Physician Assistants and Nurse Practitioners) who all work together to provide you Any Other Special Instructions Will Be Listed Below (If Applicable).

## 2019-01-05 NOTE — Progress Notes (Signed)
Virtual Visit via Doximetry dialer video   This visit type was conducted due to national recommendations for restrictions regarding the COVID-19 Pandemic (e.g. social distancing) in an effort to limit this patient's exposure and mitigate transmission in our community.  Due to his co-morbid illnesses, this patient is at least at moderate risk for complications without adequate follow up.  This format is felt to be most appropriate for this patient at this time.  The patient did not have access to video technology/had technical difficulties with video requiring transitioning to audio format only (telephone).  All issues noted in this document were discussed and addressed.  No physical exam could be performed with this format.  Please refer to the patient's chart for his  consent to telehealth for [facility].   Date:  01/05/2019   ID:  Gregory Vasquez, DOB 06-Nov-1945, MRN [id number]  Patient Location: Home Provider Location: Office  PCP:  Lawerance Cruel, MD  Cardiologist:  No primary care provider on file. Dr Bettina Gavia Electrophysiologist:  Will Meredith Leeds, MD   Evaluation Performed:  Follow-Up Visit  Chief Complaint:  PAF  History of Present Illness:    Gregory Vasquez is a 74 y.o. male with  a hx of paroxysmal atrial fibrillation last seen  08/14/18 and started on flecanide..He spontaneously to [facility] as an outpatient.  He is pleased has had no recurrence of atrial fibrillation on low-dose amiodarone and no apparent side effects.  I did caution him about sun exposure and using a sunscreen and he will come to the office to check liver function and TSH for toxicity.  His blood pressures at target but heart rate runs in the range of 50 to 55 bpm will reduce his beta-blocker if remains less than 55 he will contact me and will discontinue.  Fortunately has had no bleeding complications from his anticoagulant no shortness of breath chest pain palpitations syncope or TIA  The patient does not  have symptoms concerning for COVID-19 infection (fever, chills, cough, or new shortness of breath).    Past Medical History:  Diagnosis Date  . Hypertension    Past Surgical History:  Procedure Laterality Date  . CHOLECYSTECTOMY    . TONSILLECTOMY       No outpatient medications have been marked as taking for the 01/05/19 encounter (Appointment) with Gregory Priest, MD.     Allergies:   Multaq [dronedarone]   Social History   Tobacco Use  . Smoking status: Former Smoker    Packs/day: 2.00    Years: 10.00    Pack years: 20.00    Types: Cigarettes    Last attempt to quit: 1976    Years since quitting: 44.4  . Smokeless tobacco: Never Used  Substance Use Topics  . Alcohol use: Yes    Alcohol/week: 1.0 standard drinks    Types: 1 Cans of beer per week    Comment: 1 beer every 2 -3 days  . Drug use: Not Currently     Family Hx: The patient's family history includes CAD in his mother; Heart attack in his father; Hyperlipidemia in his mother.  ROS:   Please see the history of present illness.     All other systems reviewed and are negative.   Prior CV studies:   The following studies were reviewed today:    Labs/Other Tests and Data Reviewed:    EKG:  today his kardia Showed normal sinus rhythm 53 bpm I asked him to transmit that to the office.  Recent Labs: 03/12/2018: TSH 1.480 07/09/2018: BUN 12; Creatinine, Ser 1.24; Hemoglobin 13.6; Platelets 249; Potassium 4.9; Sodium 138   Recent Lipid Panel No results found for: CHOL, TRIG, HDL, CHOLHDL, LDLCALC, LDLDIRECT  Wt Readings from Last 3 Encounters:  09/09/18 191 lb (86.6 kg)  08/14/18 194 lb (88 kg)  07/16/18 192 lb (87.1 kg)     Objective:    Vital Signs:  There were no vitals taken for this visit.   Constitutional, well-nourished well-developed in no acute distress Vital signs reviewed Eyes, conjunctiva and sclera are normal without pallor or icterus extraocular motions intact and normal there is  no lid lag Respiratory, normal effort and excursion no audible wheezing without a stethoscope Cardiovascular, no neck vein distention or peripheral edema Skin, no rash skin lesion or ulceration of the extremities Neurologic, cranial nerves II to XII are grossly intact and the patient moves all 4 extremities Neuro/Psychiatric, judgment and thought processes are intact and coherent, alert and oriented x3, mood and affect appear normal.  ASSESSMENT & PLAN:    1. PAF improved continue low-dose amiodarone reduce beta-blocker and discontinue if bradycardia persists and continue his anticoagulant.  Check liver function TSH for toxicity utilizes sunscreen. 2. On amiodarone check labs for toxicity continue low-dose next visit check level 3. Chronic anticoagulation stable moderate stroke risk continue his current anticoagulant 4. Hypertension stable reduced dose of beta-blocker  COVID-19 Education: The signs and symptoms of COVID-19 were discussed with the patient and how to seek care for testing (follow up with PCP or arrange E-visit).  The importance of social distancing was discussed today.  Time:   Today, I have spent 27 minutes with the patient with telehealth technology discussing the above problems.     Medication Adjustments/Labs and Tests Ordered: Current medicines are reviewed at length with the patient today.  Concerns regarding medicines are outlined above.   Tests Ordered: No orders of the defined types were placed in this encounter.   Medication Changes: No orders of the defined types were placed in this encounter.   Disposition:  Follow up in 3 month(s)  Signed, Shirlee More, MD  01/05/2019 10:25 AM    [facility]

## 2019-01-05 NOTE — Telephone Encounter (Signed)
Pt would like clarification on Metoprolol dosage. Does he still take it bid on the days he is supposed to take it? Please advise.

## 2019-01-05 NOTE — Telephone Encounter (Signed)
Clarified with Dr. Bettina Gavia, Patient to take Toprol XL 25 mg daily. Telephoned patient and informed him of dosage change. Patient verbalized understanding

## 2019-01-06 NOTE — Progress Notes (Signed)
Kardiac transmission from yesterday personally reviewed sinus rhythm

## 2019-01-07 DIAGNOSIS — I480 Paroxysmal atrial fibrillation: Secondary | ICD-10-CM | POA: Diagnosis not present

## 2019-01-07 DIAGNOSIS — I10 Essential (primary) hypertension: Secondary | ICD-10-CM | POA: Diagnosis not present

## 2019-01-08 ENCOUNTER — Telehealth: Payer: Self-pay | Admitting: *Deleted

## 2019-01-08 LAB — COMPREHENSIVE METABOLIC PANEL
ALT: 24 IU/L (ref 0–44)
AST: 22 IU/L (ref 0–40)
Albumin/Globulin Ratio: 1.8 (ref 1.2–2.2)
Albumin: 4.5 g/dL (ref 3.7–4.7)
Alkaline Phosphatase: 74 IU/L (ref 39–117)
BUN/Creatinine Ratio: 12 (ref 10–24)
BUN: 15 mg/dL (ref 8–27)
Bilirubin Total: 0.4 mg/dL (ref 0.0–1.2)
CO2: 21 mmol/L (ref 20–29)
Calcium: 9.6 mg/dL (ref 8.6–10.2)
Chloride: 104 mmol/L (ref 96–106)
Creatinine, Ser: 1.27 mg/dL (ref 0.76–1.27)
GFR calc Af Amer: 64 mL/min/{1.73_m2} (ref >59)
GFR calc non Af Amer: 56 mL/min/{1.73_m2} — ABNORMAL LOW (ref >59)
Globulin, Total: 2.5 g/dL (ref 1.5–4.5)
Glucose: 163 mg/dL — ABNORMAL HIGH (ref 65–99)
Potassium: 5.2 mmol/L (ref 3.5–5.2)
Sodium: 142 mmol/L (ref 134–144)
Total Protein: 7 g/dL (ref 6.0–8.5)

## 2019-01-08 LAB — TSH: TSH: 3.55 u[IU]/mL (ref 0.450–4.500)

## 2019-01-08 NOTE — Telephone Encounter (Signed)
Telephone call to patient. Informed of lab results. NO cange in therapy. Patient verbalized understanding.

## 2019-01-08 NOTE — Telephone Encounter (Signed)
-----   Message from Austin Miles, RN sent at 01/08/2019  8:41 AM EDT -----  ----- Message ----- From: Richardo Priest, MD Sent: 01/08/2019   8:23 AM EDT To: Stevan Born, CMA  Normal or stable result  No changes

## 2019-02-12 DIAGNOSIS — H25813 Combined forms of age-related cataract, bilateral: Secondary | ICD-10-CM | POA: Diagnosis not present

## 2019-02-12 DIAGNOSIS — H43811 Vitreous degeneration, right eye: Secondary | ICD-10-CM | POA: Diagnosis not present

## 2019-02-12 DIAGNOSIS — H35372 Puckering of macula, left eye: Secondary | ICD-10-CM | POA: Diagnosis not present

## 2019-02-12 DIAGNOSIS — H04123 Dry eye syndrome of bilateral lacrimal glands: Secondary | ICD-10-CM | POA: Diagnosis not present

## 2019-03-02 ENCOUNTER — Telehealth (INDEPENDENT_AMBULATORY_CARE_PROVIDER_SITE_OTHER): Payer: Medicare Other | Admitting: Cardiology

## 2019-03-02 DIAGNOSIS — I480 Paroxysmal atrial fibrillation: Secondary | ICD-10-CM | POA: Diagnosis not present

## 2019-03-02 MED ORDER — AMIODARONE HCL 200 MG PO TABS: 100.0000 mg | ORAL_TABLET | Freq: Every day | ORAL | 1 refills | Status: DC

## 2019-03-02 NOTE — Progress Notes (Signed)
Electrophysiology TeleHealth Note   Due to national recommendations of social distancing due to COVID 19, an audio/video telehealth visit is felt to be most appropriate for this patient at this time.  See Epic message for the patient's consent to telehealth for [facility].   Date:  03/02/2019   ID:  Gregory Vasquez, DOB August 17, 1946, MRN [id number]  Location: patient's home  Provider location: [address] [place]  Evaluation Performed: Follow-up visit  PCP:  Lawerance Cruel, MD  Cardiologist: Shirlee More Electrophysiologist:  Dr Curt Bears  Chief Complaint: Atrial fibrillation  History of Present Illness:    Gregory Vasquez is a 73 y.o. male who presents via audio/video conferencing for a telehealth visit today.  Since last being seen in our clinic, the patient reports doing very well.  Today, he denies symptoms of palpitations, chest pain, shortness of breath,  lower extremity edema, dizziness, presyncope, or syncope.  The patient is otherwise without complaint today.  The patient denies symptoms of fevers, chills, cough, or new SOB worrisome for COVID 19.  He has a history significant for hypertension and atrial fibrillation.  He is currently on amiodarone.  His symptoms of atrial fibrillation are of weakness and fatigue.  Today, denies symptoms of palpitations, chest pain, shortness of breath, orthopnea, PND, lower extremity edema, claudication, dizziness, presyncope, syncope, bleeding, or neurologic sequela. The patient is tolerating medications without difficulties.  He has felt well since last being seen.  He has had no further episodes of atrial fibrillation.  That being said he does think that he is having some side effects from his amiodarone with some tremors as well as changes in his sleep patterns.  Past Medical History:  Diagnosis Date  . Hypertension     Past Surgical History:  Procedure Laterality Date  . CHOLECYSTECTOMY    . TONSILLECTOMY       Current Outpatient Medications  Medication Sig Dispense Refill  . amiodarone (PACERONE) 200 MG tablet Take 1 tablet (200 mg total) by mouth 2 (two) times daily. For one month.  Then reduce and take 1 tablet (200 mg total) ONCE daily. 60 tablet 3  . apixaban (ELIQUIS) 5 MG TABS tablet Take 1 tablet (5 mg total) by mouth 2 (two) times daily. 180 tablet 1  . metoprolol succinate (TOPROL-XL) 25 MG 24 hr tablet Take 1 tablet (25 mg total) by mouth 2 (two) times daily. 180 tablet 1  . Multiple Vitamin (MULTIVITAMIN WITH MINERALS) TABS tablet Take 1 tablet by mouth daily. Adult Multivitamin Centrum 50+    . Omega-3 Fatty Acids (FISH OIL ULTRA) 1400 MG CAPS Take 1,400 mg by mouth daily.    Marland Kitchen omeprazole (PRILOSEC OTC) 20 MG tablet Take 10 mg by mouth daily before breakfast.    . terbinafine (LAMISIL) 250 MG tablet Take 250 mg by mouth See admin instructions. Take 1 tablet (250 mg) by mouth 1 week out of each month, then off for 3 weeks.  2   No current facility-administered medications for this visit.     Allergies:   Multaq [dronedarone]   Social History:  The patient  reports that he quit smoking about 44 years ago. His smoking use included cigarettes. He has a 20.00 pack-year smoking history. He has never used smokeless tobacco. He reports current alcohol use of about 1.0 standard drinks of alcohol per week. He reports previous drug use.   Family History:  The patient's  family history includes CAD in his mother; Heart attack  in his father; Hyperlipidemia in his mother.   ROS:  Please see the history of present illness.   All other systems are personally reviewed and negative.    Exam:    Vital Signs:  BP 124/78   Pulse (!) 55   Well appearing, alert and conversant, regular work of breathing,  good skin color Eyes- anicteric, neuro- grossly intact, skin- no apparent rash or lesions or cyanosis, mouth- oral mucosa is pink   Labs/Other Tests and Data Reviewed:    Recent Labs: 07/09/2018:  Hemoglobin 13.6; Platelets 249 01/07/2019: ALT 24; BUN 15; Creatinine, Ser 1.27; Potassium 5.2; Sodium 142; TSH 3.550   Wt Readings from Last 3 Encounters:  01/05/19 182 lb (82.6 kg)  09/09/18 191 lb (86.6 kg)  08/14/18 194 lb (88 kg)     Other studies personally reviewed: Additional studies/ records that were reviewed today include: TTE 05/12/2018 Review of the above records today demonstrates:   - Normal LVEF.   Normal LA size.   Trace TR, MR.  ECG 09/09/2018 personally reviewed Sinus rhythm rate 74  ASSESSMENT & PLAN:    1.  Paroxysmal atrial fibrillation/atrial flutter: Currently on amiodarone and Eliquis.  At this point, he would prefer to have ablation performed.  Risks and benefits were discussed and include bleeding, tamponade, heart block, stroke, damage to surrounding organs.  He understands these risks and is agreed to the procedure.  I have told him to decrease his amiodarone to 100 mg daily as he is having some sleep difficulties and some tremors that he thinks is due to the medication.  This patients CHA2DS2-VASc Score and unadjusted Ischemic Stroke Rate (% per year) is equal to 2.2 % stroke rate/year from a score of 2  Above score calculated as 1 point each if present [CHF, HTN, DM, Vascular=MI/PAD/Aortic Plaque, Age if 65-74, or Male] Above score calculated as 2 points each if present [Age > 75, or Stroke/TIA/TE]  2.  Hypertension: Currently well controlled.  I am curious as to the diagnosis of hypertension as he has not had any elevated blood pressures recently and is on small doses of metoprolol.  May be able to stop metoprolol post ablation.   COVID 19 screen The patient denies symptoms of COVID 19 at this time.  The importance of social distancing was discussed today.  Follow-up: 3 months  Current medicines are reviewed at length with the patient today.   The patient does not have concerns regarding his medicines.  The following changes were made today:  Decrease amiodarone  Labs/ tests ordered today include:  No orders of the defined types were placed in this encounter.    Patient Risk:  after full review of this patients clinical status, I feel that they are at moderate risk at this time.  Today, I have spent 12 minutes with the patient with telehealth technology discussing atrial fibrillation.    Signed, Viviene Thurston Meredith Leeds, MD  03/02/2019 11:43 AM     [facility] [address] [place] [address] [phone] (office) [phone] (fax)

## 2019-03-02 NOTE — Addendum Note (Signed)
Addended by: Stanton Kidney on: 03/02/2019 11:54 AM   Modules accepted: Orders

## 2019-03-24 ENCOUNTER — Telehealth: Payer: Self-pay | Admitting: *Deleted

## 2019-03-24 DIAGNOSIS — Z01812 Encounter for preprocedural laboratory examination: Secondary | ICD-10-CM

## 2019-03-24 DIAGNOSIS — I4819 Other persistent atrial fibrillation: Secondary | ICD-10-CM

## 2019-03-24 NOTE — Telephone Encounter (Signed)
Pt agreeable to Afib ablation Pt scheduled for 8/14 Pt will stop by the [facility] office tomorrow for pre procedure lab work. CT testing 8/10 COVID pre procedural screening 8/11. Aware [facility] will call to arrange 34mo f/u post ablaton. 1mo f/u w/ Dr. Curt Bears scheduled for 11/23 in [facility] office. Instructions reviewed w/ pt and sent via Mychart. Patient verbalized understanding and agreeable to plan.

## 2019-03-27 ENCOUNTER — Telehealth (HOSPITAL_COMMUNITY): Payer: Self-pay | Admitting: Emergency Medicine

## 2019-03-27 DIAGNOSIS — Z01812 Encounter for preprocedural laboratory examination: Secondary | ICD-10-CM | POA: Diagnosis not present

## 2019-03-27 DIAGNOSIS — I4819 Other persistent atrial fibrillation: Secondary | ICD-10-CM | POA: Diagnosis not present

## 2019-03-27 NOTE — Telephone Encounter (Signed)
-----   Message from Lorenza Evangelist, RN sent at 03/27/2019 3:30 PM EDT -----  Patient having questions about pre-ablation testing (covid swab) and where to go for this. Can you help him?    Attempted to reach pt to discuss. No answer. Will send information via mychart for pt.

## 2019-03-27 NOTE — Telephone Encounter (Signed)
Reaching out to patient to offer assistance regarding upcoming cardiac imaging study; pt verbalizes understanding of appt date/time, parking situation and where to check in, pre-test NPO status and medications ordered, and verified current allergies; name and call back number provided for further questions should they arise Sehar Sedano RN Navigator Cardiac Imaging [facility] [phone] office [phone] cell  Pt denies covid symptoms, verbalized understanding of visitor policy. 

## 2019-03-28 LAB — CBC
Hematocrit: 43.3 % (ref 37.5–51.0)
Hemoglobin: 14.7 g/dL (ref 13.0–17.7)
MCH: 28.9 pg (ref 26.6–33.0)
MCHC: 33.9 g/dL (ref 31.5–35.7)
MCV: 85 fL (ref 79–97)
Platelets: 222 10*3/uL (ref 150–450)
RBC: 5.09 x10E6/uL (ref 4.14–5.80)
RDW: 15 % (ref 11.6–15.4)
WBC: 5.9 10*3/uL (ref 3.4–10.8)

## 2019-03-28 LAB — BASIC METABOLIC PANEL
BUN/Creatinine Ratio: 15 (ref 10–24)
BUN: 17 mg/dL (ref 8–27)
CO2: 23 mmol/L (ref 20–29)
Calcium: 9 mg/dL (ref 8.6–10.2)
Chloride: 104 mmol/L (ref 96–106)
Creatinine, Ser: 1.13 mg/dL (ref 0.76–1.27)
GFR calc Af Amer: 74 mL/min/{1.73_m2} (ref >59)
GFR calc non Af Amer: 64 mL/min/{1.73_m2} (ref >59)
Glucose: 112 mg/dL — ABNORMAL HIGH (ref 65–99)
Potassium: 4.7 mmol/L (ref 3.5–5.2)
Sodium: 140 mmol/L (ref 134–144)

## 2019-03-30 ENCOUNTER — Other Ambulatory Visit: Payer: Self-pay

## 2019-03-30 ENCOUNTER — Ambulatory Visit (HOSPITAL_COMMUNITY)
Admission: RE | Admit: 2019-03-30 | Discharge: 2019-03-30 | Disposition: A | Discharge status: Home / Self Care | Payer: Medicare Other | Source: Ambulatory Visit | Attending: Cardiology | Admitting: Cardiology

## 2019-03-30 DIAGNOSIS — I4819 Other persistent atrial fibrillation: Secondary | ICD-10-CM | POA: Insufficient documentation

## 2019-03-30 MED ORDER — IOHEXOL 350 MG/ML SOLN
100.0000 mL | Freq: Once | INTRAVENOUS | Status: AC | PRN
  Administered 2019-03-30: 11:00:00 100 mL via INTRAVENOUS

## 2019-03-31 ENCOUNTER — Other Ambulatory Visit (HOSPITAL_COMMUNITY)
Admission: RE | Admit: 2019-03-31 | Discharge: 2019-03-31 | Disposition: A | Discharge status: Home / Self Care | Payer: Medicare Other | Source: Ambulatory Visit | Attending: Cardiology | Admitting: Cardiology

## 2019-03-31 DIAGNOSIS — Z20828 Contact with and (suspected) exposure to other viral communicable diseases: Secondary | ICD-10-CM | POA: Insufficient documentation

## 2019-03-31 DIAGNOSIS — Z01812 Encounter for preprocedural laboratory examination: Secondary | ICD-10-CM | POA: Insufficient documentation

## 2019-03-31 LAB — SARS CORONAVIRUS 2 (TAT 6-24 HRS): SARS Coronavirus 2: NEGATIVE

## 2019-04-02 NOTE — Anesthesia Preprocedure Evaluation (Addendum)
Anesthesia Evaluation  Patient identified by MRN, date of birth, ID band Patient awake    Reviewed: Allergy & Precautions, NPO status , Patient's Chart, lab work & pertinent test results  Airway Mallampati: II  TM Distance: >3 FB Neck ROM: Full    Dental no notable dental hx. (+) Teeth Intact   Pulmonary former smoker,    Pulmonary exam normal breath sounds clear to auscultation       Cardiovascular Exercise Tolerance: Good hypertension, Normal cardiovascular exam+ dysrhythmias Atrial Fibrillation  Rhythm:Regular Rate:Normal  05/12/18 Echo  Left ventricle: The cavity size was normal. Wall thickness was   increased in a pattern of mild LVH. Systolic function was normal.   The estimated ejection fraction was in the range of 55% to 60%.   Wall motion was normal; there were no regional wall motion   abnormalities.   Neuro/Psych negative neurological ROS  negative psych ROS   GI/Hepatic negative GI ROS, Neg liver ROS,   Endo/Other  negative endocrine ROS  Renal/GU negative Renal ROS     Musculoskeletal   Abdominal   Peds  Hematology   Anesthesia Other Findings   Reproductive/Obstetrics                            Lab Results  Component Value Date   CREATININE 1.13 03/27/2019   BUN 17 03/27/2019   NA 140 03/27/2019   K 4.7 03/27/2019   CL 104 03/27/2019   CO2 23 03/27/2019    Lab Results  Component Value Date   WBC 5.9 03/27/2019   HGB 14.7 03/27/2019   HCT 43.3 03/27/2019   MCV 85 03/27/2019   PLT 222 03/27/2019    Anesthesia Physical Anesthesia Plan  ASA: III  Anesthesia Plan: General   Post-op Pain Management:    Induction: Intravenous  PONV Risk Score and Plan: 3 and Treatment may vary due to age or medical condition, Dexamethasone and Ondansetron  Airway Management Planned: Oral ETT  Additional Equipment:   Intra-op Plan:   Post-operative Plan: Extubation in  OR  Informed Consent: I have reviewed the patients History and Physical, chart, labs and discussed the procedure including the risks, benefits and alternatives for the proposed anesthesia with the patient or authorized representative who has indicated his/her understanding and acceptance.     Dental advisory given  Plan Discussed with: CRNA  Anesthesia Plan Comments:        Anesthesia Quick Evaluation

## 2019-04-03 ENCOUNTER — Other Ambulatory Visit: Payer: Self-pay

## 2019-04-03 ENCOUNTER — Encounter (HOSPITAL_COMMUNITY)
Admission: RE | Disposition: A | Discharge status: Home / Self Care | Payer: Self-pay | Source: Home / Self Care | Attending: Cardiology

## 2019-04-03 ENCOUNTER — Encounter (HOSPITAL_COMMUNITY): Payer: Self-pay | Admitting: Certified Registered Nurse Anesthetist

## 2019-04-03 ENCOUNTER — Ambulatory Visit (HOSPITAL_COMMUNITY): Payer: Medicare Other | Admitting: Anesthesiology

## 2019-04-03 ENCOUNTER — Ambulatory Visit (HOSPITAL_COMMUNITY)
Admission: RE | Admit: 2019-04-03 | Discharge: 2019-04-03 | Disposition: A | Discharge status: Home / Self Care | Payer: Medicare Other | Attending: Cardiology | Admitting: Cardiology

## 2019-04-03 DIAGNOSIS — I4891 Unspecified atrial fibrillation: Secondary | ICD-10-CM | POA: Diagnosis not present

## 2019-04-03 DIAGNOSIS — Z87891 Personal history of nicotine dependence: Secondary | ICD-10-CM | POA: Diagnosis not present

## 2019-04-03 DIAGNOSIS — I4892 Unspecified atrial flutter: Secondary | ICD-10-CM | POA: Insufficient documentation

## 2019-04-03 DIAGNOSIS — I10 Essential (primary) hypertension: Secondary | ICD-10-CM | POA: Diagnosis not present

## 2019-04-03 DIAGNOSIS — Z7901 Long term (current) use of anticoagulants: Secondary | ICD-10-CM | POA: Diagnosis not present

## 2019-04-03 DIAGNOSIS — Z8249 Family history of ischemic heart disease and other diseases of the circulatory system: Secondary | ICD-10-CM | POA: Diagnosis not present

## 2019-04-03 DIAGNOSIS — I480 Paroxysmal atrial fibrillation: Secondary | ICD-10-CM | POA: Insufficient documentation

## 2019-04-03 DIAGNOSIS — I483 Typical atrial flutter: Secondary | ICD-10-CM | POA: Diagnosis not present

## 2019-04-03 HISTORY — PX: ATRIAL FIBRILLATION ABLATION: EP1191

## 2019-04-03 LAB — POCT ACTIVATED CLOTTING TIME
Activated Clotting Time: 263 seconds
Activated Clotting Time: 285 seconds
Activated Clotting Time: 340 seconds
Activated Clotting Time: 158 seconds

## 2019-04-03 SURGERY — ATRIAL FIBRILLATION ABLATION
Anesthesia: General

## 2019-04-03 MED ORDER — HEPARIN (PORCINE) IN NACL 1000-0.9 UT/500ML-% IV SOLN
INTRAVENOUS | Status: AC
  Filled 2019-04-03: qty 500

## 2019-04-03 MED ORDER — HEPARIN (PORCINE) IN NACL 1000-0.9 UT/500ML-% IV SOLN
INTRAVENOUS | Status: DC | PRN
  Administered 2019-04-03 (×5): 500 mL

## 2019-04-03 MED ORDER — HEPARIN SODIUM (PORCINE) 1000 UNIT/ML IJ SOLN
INTRAMUSCULAR | Status: AC
  Filled 2019-04-03: qty 1

## 2019-04-03 MED ORDER — ACETAMINOPHEN 10 MG/ML IV SOLN
1000.0000 mg | Freq: Once | INTRAVENOUS | Status: DC | PRN
  Filled 2019-04-03: qty 100

## 2019-04-03 MED ORDER — SODIUM CHLORIDE 0.9 % IV SOLN
INTRAVENOUS | Status: DC | PRN
  Administered 2019-04-03: 08:00:00 20 ug/min via INTRAVENOUS

## 2019-04-03 MED ORDER — SODIUM CHLORIDE 0.9% FLUSH: 3.0000 mL | INTRAVENOUS | Status: DC | PRN

## 2019-04-03 MED ORDER — TERBINAFINE HCL 250 MG PO TABS: 250.0000 mg | ORAL_TABLET | ORAL | Status: DC

## 2019-04-03 MED ORDER — FENTANYL CITRATE (PF) 100 MCG/2ML IJ SOLN
25.0000 ug | INTRAMUSCULAR | Status: DC | PRN
  Filled 2019-04-03: qty 2

## 2019-04-03 MED ORDER — FENTANYL CITRATE (PF) 100 MCG/2ML IJ SOLN
25.0000 ug | Freq: Once | INTRAMUSCULAR | Status: AC
  Administered 2019-04-03: 25 ug via INTRAVENOUS

## 2019-04-03 MED ORDER — PROTAMINE SULFATE 10 MG/ML IV SOLN
INTRAVENOUS | Status: DC | PRN
  Administered 2019-04-03: 10 mg via INTRAVENOUS

## 2019-04-03 MED ORDER — FISH OIL ULTRA 1400 MG PO CAPS: 1400.0000 mg | ORAL_CAPSULE | Freq: Every day | ORAL | Status: DC

## 2019-04-03 MED ORDER — SUGAMMADEX SODIUM 200 MG/2ML IV SOLN
INTRAVENOUS | Status: DC | PRN
  Administered 2019-04-03: 200 mg via INTRAVENOUS

## 2019-04-03 MED ORDER — LIDOCAINE 2% (20 MG/ML) 5 ML SYRINGE
INTRAMUSCULAR | Status: DC | PRN
  Administered 2019-04-03: 100 mg via INTRAVENOUS

## 2019-04-03 MED ORDER — ONDANSETRON HCL 4 MG/2ML IJ SOLN
INTRAMUSCULAR | Status: DC | PRN
  Administered 2019-04-03: 4 mg via INTRAVENOUS

## 2019-04-03 MED ORDER — FENTANYL CITRATE (PF) 100 MCG/2ML IJ SOLN
INTRAMUSCULAR | Status: DC | PRN
  Administered 2019-04-03: 50 ug via INTRAVENOUS

## 2019-04-03 MED ORDER — HEPARIN SODIUM (PORCINE) 1000 UNIT/ML IJ SOLN
INTRAMUSCULAR | Status: DC | PRN
  Administered 2019-04-03: 1000 [IU] via INTRAVENOUS

## 2019-04-03 MED ORDER — DOBUTAMINE IN D5W 4-5 MG/ML-% IV SOLN
INTRAVENOUS | Status: DC | PRN
  Administered 2019-04-03: 20 ug/kg/min via INTRAVENOUS

## 2019-04-03 MED ORDER — ACETAMINOPHEN 325 MG PO TABS
650.0000 mg | ORAL_TABLET | ORAL | Status: DC | PRN
  Administered 2019-04-03: 650 mg via ORAL

## 2019-04-03 MED ORDER — DEXAMETHASONE SODIUM PHOSPHATE 10 MG/ML IJ SOLN
INTRAMUSCULAR | Status: DC | PRN
  Administered 2019-04-03: 10 mg via INTRAVENOUS

## 2019-04-03 MED ORDER — SODIUM CHLORIDE 0.9 % IV SOLN: 250.0000 mL | INTRAVENOUS | Status: DC | PRN

## 2019-04-03 MED ORDER — PROPOFOL 10 MG/ML IV BOLUS
INTRAVENOUS | Status: DC | PRN
  Administered 2019-04-03: 110 mg via INTRAVENOUS

## 2019-04-03 MED ORDER — KETOROLAC TROMETHAMINE 15 MG/ML IJ SOLN
15.0000 mg | Freq: Once | INTRAMUSCULAR | Status: AC
  Administered 2019-04-03: 15 mg via INTRAVENOUS
  Filled 2019-04-03 (×2): qty 1

## 2019-04-03 MED ORDER — EPHEDRINE SULFATE-NACL 50-0.9 MG/10ML-% IV SOSY
PREFILLED_SYRINGE | INTRAVENOUS | Status: DC | PRN
  Administered 2019-04-03: 5 mg via INTRAVENOUS

## 2019-04-03 MED ORDER — ACETAMINOPHEN 325 MG PO TABS
ORAL_TABLET | ORAL | Status: AC
  Filled 2019-04-03: qty 2

## 2019-04-03 MED ORDER — ONDANSETRON HCL 4 MG/2ML IJ SOLN: 4.0000 mg | Freq: Four times a day (QID) | INTRAMUSCULAR | Status: DC | PRN

## 2019-04-03 MED ORDER — ONDANSETRON HCL 4 MG/2ML IJ SOLN: 4.0000 mg | Freq: Once | INTRAMUSCULAR | Status: DC | PRN

## 2019-04-03 MED ORDER — APIXABAN 5 MG PO TABS: 5.0000 mg | ORAL_TABLET | Freq: Two times a day (BID) | ORAL | Status: DC

## 2019-04-03 MED ORDER — ADULT MULTIVITAMIN W/MINERALS CH: 1.0000 | ORAL_TABLET | Freq: Every day | ORAL | Status: DC

## 2019-04-03 MED ORDER — AMIODARONE HCL 100 MG PO TABS: 100.0000 mg | ORAL_TABLET | Freq: Every day | ORAL | Status: DC

## 2019-04-03 MED ORDER — SODIUM CHLORIDE 0.9% FLUSH: 3.0000 mL | Freq: Two times a day (BID) | INTRAVENOUS | Status: DC

## 2019-04-03 MED ORDER — METOPROLOL SUCCINATE ER 25 MG PO TB24
ORAL_TABLET | ORAL | Status: AC
  Filled 2019-04-03: qty 1

## 2019-04-03 MED ORDER — METOPROLOL SUCCINATE ER 25 MG PO TB24
25.0000 mg | ORAL_TABLET | Freq: Every day | ORAL | Status: DC
  Administered 2019-04-03: 25 mg via ORAL

## 2019-04-03 MED ORDER — OMEPRAZOLE MAGNESIUM 20 MG PO TBEC: 10.0000 mg | DELAYED_RELEASE_TABLET | Freq: Every day | ORAL | Status: DC

## 2019-04-03 MED ORDER — SODIUM CHLORIDE 0.9 % IV SOLN
INTRAVENOUS | Status: DC
  Administered 2019-04-03: 06:00:00 via INTRAVENOUS

## 2019-04-03 MED ORDER — POLYVINYL ALCOHOL 1.4 % OP SOLN: 1.0000 [drp] | OPHTHALMIC | Status: DC | PRN

## 2019-04-03 MED ORDER — BUPIVACAINE HCL (PF) 0.25 % IJ SOLN
INTRAMUSCULAR | Status: AC
  Filled 2019-04-03: qty 30

## 2019-04-03 MED ORDER — HEPARIN SODIUM (PORCINE) 1000 UNIT/ML IJ SOLN
INTRAMUSCULAR | Status: DC | PRN
  Administered 2019-04-03: 13000 [IU] via INTRAVENOUS
  Administered 2019-04-03 (×2): 5000 [IU] via INTRAVENOUS

## 2019-04-03 MED ORDER — ROCURONIUM BROMIDE 100 MG/10ML IV SOLN
INTRAVENOUS | Status: DC | PRN
  Administered 2019-04-03: 50 mg via INTRAVENOUS

## 2019-04-03 MED ORDER — BUPIVACAINE HCL (PF) 0.25 % IJ SOLN
INTRAMUSCULAR | Status: DC | PRN
  Administered 2019-04-03: 45 mL

## 2019-04-03 MED ORDER — MIDAZOLAM HCL 5 MG/5ML IJ SOLN
INTRAMUSCULAR | Status: DC | PRN
  Administered 2019-04-03 (×2): 1 mg via INTRAVENOUS

## 2019-04-03 SURGICAL SUPPLY — 20 items
BLANKET WARM UNDERBOD FULL ACC (MISCELLANEOUS) ×2 IMPLANT
CATH MAPPNG PENTARAY F 2-6-2MM (CATHETERS) IMPLANT
CATH SMTCH THERMOCOOL SF DF (CATHETERS) ×1 IMPLANT
CATH SOUNDSTAR ECO REPROCESSED (CATHETERS) ×1 IMPLANT
CATH WEBSTER BI DIR CS D-F CRV (CATHETERS) ×1 IMPLANT
COVER SWIFTLINK CONNECTOR (BAG) ×2 IMPLANT
PACK EP LATEX FREE (CUSTOM PROCEDURE TRAY) ×2
PACK EP LF (CUSTOM PROCEDURE TRAY) ×1 IMPLANT
PAD PRO RADIOLUCENT 2001M-C (PAD) ×2 IMPLANT
PATCH CARTO3 (PAD) ×1 IMPLANT
PENTARAY F 2-6-2MM (CATHETERS) ×2
SHEATH AVANTI 11F 11CM (SHEATH) ×1 IMPLANT
SHEATH BAYLIS SUREFLEX  M 8.5 (SHEATH) ×1
SHEATH BAYLIS SUREFLEX M 8.5 (SHEATH) IMPLANT
SHEATH BAYLIS TRANSSEPTAL 98CM (NEEDLE) ×1 IMPLANT
SHEATH CARTO VIZIGO SM CVD (SHEATH) ×1 IMPLANT
SHEATH PINNACLE 7F 10CM (SHEATH) ×1 IMPLANT
SHEATH PINNACLE 8F 10CM (SHEATH) ×2 IMPLANT
SHEATH PINNACLE 9F 10CM (SHEATH) ×2 IMPLANT
TUBING SMART ABLATE COOLFLOW (TUBING) ×1 IMPLANT

## 2019-04-03 NOTE — Transfer of Care (Signed)
Immediate Anesthesia Transfer of Care Note  Patient: Gregory Vasquez  Procedure(s) Performed: ATRIAL FIBRILLATION ABLATION (N/A )  Patient Location: Cath Lab  Anesthesia Type:General  Level of Consciousness: awake, alert  and oriented  Airway & Oxygen Therapy: Patient Spontanous Breathing and Patient connected to nasal cannula oxygen  Post-op Assessment: Report given to RN and Post -op Vital signs reviewed and stable  Post vital signs: Reviewed and stable  Last Vitals:  Vitals Value Taken Time  BP 122/81 04/03/19 1030  Temp 36.4 C 04/03/19 1025  Pulse 66 04/03/19 1030  Resp 12 04/03/19 1030  SpO2 98 % 04/03/19 1030  Vitals shown include unvalidated device data.  Last Pain:  Vitals:   04/03/19 1025  TempSrc: Temporal  PainSc: Asleep      Patients Stated Pain Goal: 5 (70/[id number])  Complications: No apparent anesthesia complications

## 2019-04-03 NOTE — Progress Notes (Signed)
Ambulated to bathroom tol well. States being up walking helps with tthe pain in the right hip

## 2019-04-03 NOTE — Progress Notes (Signed)
Site area: left groin fv sheaths x2 Site Prior to Removal:  Level 0 Pressure Applied For: 15 minutes Manual:   yes Patient Status During Pull:  stable Post Pull Site:  Level 0 Post Pull Instructions Given:  yes Post Pull Pulses Present: left dp palpable Dressing Applied:  Gauze and tegaderm Bedrest begins @  Comments:

## 2019-04-03 NOTE — Anesthesia Procedure Notes (Signed)
Procedure Name: Intubation Date/Time: 04/03/2019 7:40 AM Performed by: Candis Shine, CRNA Pre-anesthesia Checklist: Patient identified, Emergency Drugs available, Suction available and Patient being monitored Patient Re-evaluated:Patient Re-evaluated prior to induction Oxygen Delivery Method: Circle System Utilized Preoxygenation: Pre-oxygenation with 100% oxygen Induction Type: IV induction Ventilation: Mask ventilation without difficulty Laryngoscope Size: Mac and 4 Grade View: Grade I Tube type: Oral Tube size: 7.5 mm Number of attempts: 1 Airway Equipment and Method: Stylet Placement Confirmation: ETT inserted through vocal cords under direct vision,  positive ETCO2 and breath sounds checked- equal and bilateral Secured at: 22 cm Tube secured with: Tape Dental Injury: Teeth and Oropharynx as per pre-operative assessment

## 2019-04-03 NOTE — Progress Notes (Addendum)
Renee, PA  paged pt c/o spasms and lower back pain and elevated BP

## 2019-04-03 NOTE — Progress Notes (Signed)
Renee, PA and Dr Curt Bears into see pt new orders noted

## 2019-04-03 NOTE — Discharge Instructions (Signed)
Femoral Site Care °This sheet gives you information about how to care for yourself after your procedure. Your health care provider may also give you more specific instructions. If you have problems or questions, contact your health care provider. °What can I expect after the procedure? °After the procedure, it is common to have: °· Bruising that usually fades within 1-2 weeks. °· Tenderness at the site. °Follow these instructions at home: °Wound care °· Follow instructions from your health care provider about how to take care of your insertion site. Make sure you: °? Wash your hands with soap and water before you change your bandage (dressing). If soap and water are not available, use hand sanitizer. °? Change your dressing as told by your health care provider. °? Leave stitches (sutures), skin glue, or adhesive strips in place. These skin closures may need to stay in place for 2 weeks or longer. If adhesive strip edges start to loosen and curl up, you may trim the loose edges. Do not remove adhesive strips completely unless your health care provider tells you to do that. °· Do not take baths, swim, or use a hot tub until your health care provider approves. °· You may shower 24-48 hours after the procedure or as told by your health care provider. °? Gently wash the site with plain soap and water. °? Pat the area dry with a clean towel. °? Do not rub the site. This may cause bleeding. °· Do not apply powder or lotion to the site. Keep the site clean and dry. °· Check your femoral site every day for signs of infection. Check for: °? Redness, swelling, or pain. °? Fluid or blood. °? Warmth. °? Pus or a bad smell. °Activity °· For the first 2-3 days after your procedure, or as long as directed: °? Avoid climbing stairs as much as possible. °? Do not squat. °· Do not lift anything that is heavier than 10 lb (4.5 kg), or the limit that you are told, until your health care provider says that it is safe. °· Rest as  directed. °? Avoid sitting for a long time without moving. Get up to take short walks every 1-2 hours. °· Do not drive for 24 hours if you were given a medicine to help you relax (sedative). °General instructions °· Take over-the-counter and prescription medicines only as told by your health care provider. °· Keep all follow-up visits as told by your health care provider. This is important. °Contact a health care provider if you have: °· A fever or chills. °· You have redness, swelling, or pain around your insertion site. °Get help right away if: °· The catheter insertion area swells very fast. °· You pass out. °· You suddenly start to sweat or your skin gets clammy. °· The catheter insertion area is bleeding, and the bleeding does not stop when you hold steady pressure on the area. °· The area near or just beyond the catheter insertion site becomes pale, cool, tingly, or numb. °These symptoms may represent a serious problem that is an emergency. Do not wait to see if the symptoms will go away. Get medical help right away. Call your local emergency services (911 in the [place] Do not drive yourself to the hospital. °Summary °· After the procedure, it is common to have bruising that usually fades within 1-2 weeks. °· Check your femoral site every day for signs of infection. °· Do not lift anything that is heavier than 10 lb (4.5 kg), or the   limit that you are told, until your health care provider says that it is safe. This information is not intended to replace advice given to you by your health care provider. Make sure you discuss any questions you have with your health care provider. Document Released: 04/09/2014 Document Revised: 08/19/2017 Document Reviewed: 08/19/2017 Elsevier Patient Education  2020 [place] procedure care instructions No driving for 4 days. No lifting over 5 lbs for 1 week. No sexual activity for 1 week. You may return to work on 04/10/2019. Keep procedure site clean & dry. If  you notice increased pain, swelling, bleeding or pus, call/return!  You may shower, but no soaking baths/hot tubs/pools for 1 week.   You have an appointment set up with the [facility].  Multiple studies have shown that being followed by a dedicated atrial fibrillation clinic in addition to the standard care you receive from your other physicians improves health. We believe that enrollment in the atrial fibrillation clinic will allow [place] to better care for you.   The phone number to the [facility] is [phone]. The clinic is staffed Monday through Friday from 8:30am to 5pm.  Parking Directions: The clinic is located in the Heart and Vascular Building connected to [facility]. 1)From [address] turn on to [address] and go to the 3rd entrance  (Heart and Vascular entrance) on the right. 2)Look to the right for Heart &Vascular Parking Garage. 3)A code for the entrance is required please call the clinic to receive this.   4)Take the elevators to the 1st floor. Registration is in the room with the glass walls at the end of the hallway.  If you have any trouble parking or locating the clinic, please dont hesitate to call [phone].

## 2019-04-03 NOTE — Progress Notes (Signed)
Site area:  Rt groin fv sheaths Site Prior to Removal:  Level 0 Pressure Applied For: 15 minutes Manual:   yes Patient Status During Pull:  stable Post Pull Site:  Level 0 Post Pull Instructions Given:  yes Post Pull Pulses Present: rt dp palpable Dressing Applied:  Gauze and tegaderm Bedrest begins @ 1130 Comments: IV SALINE LOCKED

## 2019-04-03 NOTE — H&P (Signed)
Electrophysiology TeleHealth Note   Due to national recommendations of social distancing due to COVID 19, an audio/video telehealth visit is felt to be most appropriate for this patient at this time.  See Epic message for the patient's consent to telehealth for [facility].   Date:  04/03/2019   ID:  Gregory Vasquez, DOB [birth year], MRN [id number]  Location: patient's home  Provider location: [address], [place]  Evaluation Performed: Follow-up visit  PCP:  Lawerance Cruel, MD  Cardiologist: Shirlee More Electrophysiologist:  Dr Curt Bears  Chief Complaint: Atrial fibrillation  History of Present Illness:    Gregory Vasquez is a 73 y.o. male who presents via audio/video conferencing for a telehealth visit today.  Since last being seen in our clinic, the patient reports doing very well.  Today, he denies symptoms of palpitations, chest pain, shortness of breath,  lower extremity edema, dizziness, presyncope, or syncope.  The patient is otherwise without complaint today.  The patient denies symptoms of fevers, chills, cough, or new SOB worrisome for COVID 19.  He has a history significant for hypertension and atrial fibrillation.  He is currently on amiodarone.  His symptoms of atrial fibrillation are of weakness and fatigue.  Today, denies symptoms of palpitations, chest pain, shortness of breath, orthopnea, PND, lower extremity edema, claudication, dizziness, presyncope, syncope, bleeding, or neurologic sequela. The patient is tolerating medications without difficulties.  He has felt well since last being seen.  He has had no further episodes of atrial fibrillation.  That being said he does think that he is having some side effects from his amiodarone with some tremors as well as changes in his sleep patterns.  Past Medical History:  Diagnosis Date  . Hypertension     Past Surgical History:  Procedure Laterality Date  . CHOLECYSTECTOMY    . TONSILLECTOMY       Current Facility-Administered Medications  Medication Dose Route Frequency Provider Last Rate Last Dose  . 0.9 %  sodium chloride infusion   Intravenous Continuous Constance Haw, MD 50 mL/hr at 04/03/19 8938      Allergies:   Multaq [dronedarone]   Social History:  The patient  reports that he quit smoking about 44 years ago. His smoking use included cigarettes. He has a 20.00 pack-year smoking history. He has never used smokeless tobacco. He reports current alcohol use of about 1.0 standard drinks of alcohol per week. He reports previous drug use.   Family History:  The patient's  family history includes CAD in his mother; Heart attack in his father; Hyperlipidemia in his mother.   ROS:  Please see the history of present illness.   All other systems are personally reviewed and negative.    Exam:    Vital Signs:  BP (!) 191/97   Pulse 62   Temp 97.8 F (36.6 C) (Skin)   Resp 18   Ht 5\' 10"  (1.778 m)   Wt 86.2 kg   SpO2 98%   BMI 27.26 kg/m   Well appearing, alert and conversant, regular work of breathing,  good skin color Eyes- anicteric, neuro- grossly intact, skin- no apparent rash or lesions or cyanosis, mouth- oral mucosa is pink   Labs/Other Tests and Data Reviewed:    Recent Labs: 01/07/2019: ALT 24; TSH 3.550 03/27/2019: BUN 17; Creatinine, Ser 1.13; Hemoglobin 14.7; Platelets 222; Potassium 4.7; Sodium 140   Wt Readings from Last 3 Encounters:  04/03/19 86.2 kg  01/05/19 82.6 kg  09/09/18  86.6 kg     Other studies personally reviewed: Additional studies/ records that were reviewed today include: TTE 05/12/2018 Review of the above records today demonstrates:   - Normal LVEF.   Normal LA size.   Trace TR, MR.  ECG 09/09/2018 personally reviewed Sinus rhythm rate 74  ASSESSMENT & PLAN:    1.  Paroxysmal atrial fibrillation/atrial flutter: Currently on amiodarone and Eliquis.  At this point, he would prefer to have ablation performed.  Risks and benefits  were discussed and include bleeding, tamponade, heart block, stroke, damage to surrounding organs.  He understands these risks and is agreed to the procedure.  I have told him to decrease his amiodarone to 100 mg daily as he is having some sleep difficulties and some tremors that he thinks is due to the medication.  This patients CHA2DS2-VASc Score and unadjusted Ischemic Stroke Rate (% per year) is equal to 2.2 % stroke rate/year from a score of 2  Above score calculated as 1 point each if present [CHF, HTN, DM, Vascular=MI/PAD/Aortic Plaque, Age if 65-74, or Male] Above score calculated as 2 points each if present [Age > 75, or Stroke/TIA/TE]  2.  Hypertension: Currently well controlled.  I am curious as to the diagnosis of hypertension as he has not had any elevated blood pressures recently and is on small doses of metoprolol.  May be able to stop metoprolol post ablation.   Signed, Arleth Mccullar Meredith Leeds, MD  04/03/2019 7:12 AM     [name] has presented today for surgery, with the diagnosis of atrial fibrillation.  The various methods of treatment have been discussed with the patient and family. After consideration of risks, benefits and other options for treatment, the patient has consented to  Procedure(s): Catheter ablation as a surgical intervention .  Risks include but not limited to bleeding, tamponade, heart block, stroke, damage to surrounding organs, among others. The patient's history has been reviewed, patient examined, no change in status, stable for surgery.  I have reviewed the patient's chart and labs.  Questions were answered to the patient's satisfaction.    Ke[place]dra [place]over Curt Bears, MD 04/03/2019 7:13 AM

## 2019-04-06 ENCOUNTER — Telehealth: Payer: Self-pay | Admitting: *Deleted

## 2019-04-06 ENCOUNTER — Encounter (HOSPITAL_COMMUNITY): Payer: Self-pay | Admitting: Cardiology

## 2019-04-06 NOTE — Telephone Encounter (Signed)
-----   Message from Will Meredith Leeds, MD sent at 03/31/2019  9:39 AM EDT ----- Coronary calcifications seen. Needs fasting lipids. CT with out LAA thrombus reviewed.

## 2019-04-06 NOTE — Telephone Encounter (Signed)
Pt has been notified of CT results by phone with verbal understanding. Pt is agreeable to fasting labs to be done. Pt does have appt tomorrow with Dr. Bettina Gavia. I will also send a message to Dr. Bettina Gavia and his nurse Sheilah Pigeon, RN asking if they could add on labs per Dr. Curt Bears recommendation. Pt will need fasting lipid and lft. Pt is aware to come to appt tomorrow fasting. Pt thanked me for the call. The patient has been notified of the result and verbalized understanding.  All questions (if any) were answered. Julaine Hua, [facility] 04/06/2019 5:17 PM

## 2019-04-06 NOTE — Addendum Note (Signed)
Addendum  created 04/06/19 1134 by Barnet Glasgow, MD   Clinical Note Signed

## 2019-04-06 NOTE — Anesthesia Postprocedure Evaluation (Signed)
Anesthesia Post Note  Patient: Gregory Vasquez  Procedure(s) Performed: ATRIAL FIBRILLATION ABLATION (N/A )     Patient location during evaluation: Endoscopy Anesthesia Type: General Level of consciousness: awake and alert Pain management: pain level controlled Vital Signs Assessment: post-procedure vital signs reviewed and stable Respiratory status: spontaneous breathing, nonlabored ventilation, respiratory function stable and patient connected to nasal cannula oxygen Cardiovascular status: blood pressure returned to baseline and stable Postop Assessment: no apparent nausea or vomiting Anesthetic complications: no    Last Vitals:  Vitals:   04/03/19 1630 04/03/19 1645  BP:    Pulse: 70 72  Resp: 13 (!) 24  Temp:    SpO2: 93% 96%    Last Pain:  Vitals:   04/03/19 1610  TempSrc:   PainSc: 2                  Barnet Glasgow

## 2019-04-06 NOTE — Progress Notes (Signed)
Cardiology Office Note:    Date:  04/07/2019   ID:  Gregory Vasquez, DOB September 20, 1945, MRN [id number]  PCP:  Lawerance Cruel, MD  Cardiologist:  Shirlee More, MD    Referring MD: Lawerance Cruel, MD    ASSESSMENT:    1. CAD in native artery   2. PAF (paroxysmal atrial fibrillation) ([place])   3. On amiodarone therapy   4. Chronic anticoagulation   5. Essential hypertension   6. Screening cholesterol level    PLAN:    In order of problems listed above:  1. Stable moderate stenosis awaiting FFR [place] Heart Association class I continue medical therapy anticoagulant I would not add antiplatelet await lipids to start lipid-lowering treatment and beta-blocker. 2. Stable after EP ablation continue low-dose amiodarone anticoagulant waiting for follow-up with Dr. Curt Bears 3. Stable hypertension continue beta-blocker   Next appointment: 6 months with me   Medication Adjustments/Labs and Tests Ordered: Current medicines are reviewed at length with the patient today.  Concerns regarding medicines are outlined above.  Orders Placed This Encounter  Procedures  . Comp Met (CMET)  . Lipid Profile   No orders of the defined types were placed in this encounter.   Chief Complaint  Patient presents with  . Follow-up  . Atrial Fibrillation    History of Present Illness:    Gregory Vasquez is a 73 y.o. male with a hx of PAF, HTN  last seen 01/05/2019. He had an atrial fibrillation ablation on 04/03/19. CTA showed CAC score of 307 and moedrate proximal LAD stenosis 51-69%. Compliance with diet, lifestyle and medications: Yes  This was a previous appointment scheduled prior to his atrial fibrillation EP intervention.  He was unaware that he had CAD I reviewed the fact that he has moderate stenosis of the LAD at this time he is asymptomatic and I do not think he requires cardiac interventions.  With vascular disease a statin is appropriate he tells me has been very hesitant to take 1 we  will recheck his lipids today and I think he will accept a statin and I plan on looking at the results and placing him on a high intensity statin with CAD rosuvastatin.  He remains anticoagulated on low-dose amiodarone has arrangements for follow-up with Dr. Curt Bears.  Hypertension is stable continue his current antihypertensive medication.  He is having no anginal discomfort palpitation or shortness of breath.  He has chronic right hip pain worsened after the EP procedure and no evidence of hematoma Past Medical History:  Diagnosis Date  . Hypertension     Past Surgical History:  Procedure Laterality Date  . ATRIAL FIBRILLATION ABLATION N/A 04/03/2019   Procedure: ATRIAL FIBRILLATION ABLATION;  Surgeon: Constance Haw, MD;  Location: [facility];  Service: Cardiovascular;  Laterality: N/A;  . CHOLECYSTECTOMY    . TONSILLECTOMY      Current Medications: Current Meds  Medication Sig  . amiodarone (PACERONE) 200 MG tablet Take 0.5 tablets (100 mg total) by mouth daily.  Marland Kitchen apixaban (ELIQUIS) 5 MG TABS tablet Take 1 tablet (5 mg total) by mouth 2 (two) times daily.  . Metoprolol Succinate 25 MG CS24 Take 1 tablet by mouth daily.  . Multiple Vitamin (MULTIVITAMIN WITH MINERALS) TABS tablet Take 1 tablet by mouth daily. Adult Multivitamin Centrum 50+  . Omega-3 Fatty Acids (FISH OIL ULTRA) 1400 MG CAPS Take 1,400 mg by mouth daily.  Marland Kitchen omeprazole (PRILOSEC OTC) 20 MG tablet Take 10 mg by mouth daily before  breakfast.  . polyvinyl alcohol (LIQUIFILM TEARS) 1.4 % ophthalmic solution Place 1 drop into both eyes every 4 (four) hours as needed for dry eyes.  Marland Kitchen terbinafine (LAMISIL) 250 MG tablet Take 250 mg by mouth See admin instructions. Take 1 tablet (250 mg) by mouth once a day for 1 week out of each month, then off for 3 weeks.     Allergies:   Multaq [dronedarone]   Social History   Socioeconomic History  . Marital status: Married    Spouse name: Not on file  . Number of  children: Not on file  . Years of education: Not on file  . Highest education level: Not on file  Occupational History  . Not on file  Social Needs  . Financial resource strain: Not on file  . Food insecurity    Worry: Not on file    Inability: Not on file  . Transportation needs    Medical: Not on file    Non-medical: Not on file  Tobacco Use  . Smoking status: Former Smoker    Packs/day: 2.00    Years: 10.00    Pack years: 20.00    Types: Cigarettes    Quit date: 1976    Years since quitting: 44.6  . Smokeless tobacco: Never Used  Substance and Sexual Activity  . Alcohol use: Yes    Alcohol/week: 1.0 standard drinks    Types: 1 Cans of beer per week    Comment: 1 beer every 2 -3 days  . Drug use: Not Currently  . Sexual activity: Not on file  Lifestyle  . Physical activity    Days per week: Not on file    Minutes per session: Not on file  . Stress: Not on file  Relationships  . Social Herbalist on phone: Not on file    Gets together: Not on file    Attends religious service: Not on file    Active member of club or organization: Not on file    Attends meetings of clubs or organizations: Not on file    Relationship status: Not on file  Other Topics Concern  . Not on file  Social History Narrative  . Not on file     Family History: The patient's family history includes CAD in his mother; Heart attack in his father; Hyperlipidemia in his mother. ROS:   Please see the history of present illness.    All other systems reviewed and are negative.  EKGs/Labs/Other Studies Reviewed:    The following studies were reviewed today:  Recent Labs: 01/07/2019: ALT 24; TSH 3.550 03/27/2019: BUN 17; Creatinine, Ser 1.13; Hemoglobin 14.7; Platelets 222; Potassium 4.7; Sodium 140  Recent Lipid Panel 06/25/2018 cholesterol 239 LDL 157 HDL 47  Physical Exam:    VS:  BP (!) 150/88 (BP Location: Right Arm, Patient Position: Sitting, Cuff Size: Normal)   Pulse 65    Ht '5\' 10"'  (1.778 m)   Wt 192 lb (87.1 kg)   SpO2 97%   BMI 27.55 kg/m     Wt Readings from Last 3 Encounters:  04/07/19 192 lb (87.1 kg)  04/03/19 190 lb (86.2 kg)  01/05/19 182 lb (82.6 kg)     GEN:  Well nourished, well developed in no acute distress HEENT: Normal NECK: No JVD; No carotid bruits LYMPHATICS: No lymphadenopathy CARDIAC: RRR, no murmurs, rubs, gallops RESPIRATORY:  Clear to auscultation without rales, wheezing or rhonchi  ABDOMEN: Soft, non-tender, non-distended MUSCULOSKELETAL:  No  edema; No deformity he complained of groin pain I examined both femoral vein sites have no evidence of hematoma or abnormality. SKIN: Warm and dry NEUROLOGIC:  Alert and oriented x 3 PSYCHIATRIC:  Normal affect    Signed, Shirlee More, MD  04/07/2019 11:16 AM    [place]

## 2019-04-06 NOTE — Anesthesia Postprocedure Evaluation (Signed)
Anesthesia Post Note  Patient: Gregory Vasquez  Procedure(s) Performed: ATRIAL FIBRILLATION ABLATION (N/A )     Patient location during evaluation: PACU Anesthesia Type: General Level of consciousness: awake and alert Pain management: pain level controlled Vital Signs Assessment: post-procedure vital signs reviewed and stable Respiratory status: spontaneous breathing, nonlabored ventilation, respiratory function stable and patient connected to nasal cannula oxygen Cardiovascular status: blood pressure returned to baseline and stable Postop Assessment: no apparent nausea or vomiting Anesthetic complications: no    Last Vitals:  Vitals:   04/03/19 1630 04/03/19 1645  BP:    Pulse: 70 72  Resp: 13 (!) 24  Temp:    SpO2: 93% 96%    Last Pain:  Vitals:   04/03/19 1610  TempSrc:   PainSc: 2                  Barnet Glasgow

## 2019-04-07 ENCOUNTER — Other Ambulatory Visit: Payer: Self-pay

## 2019-04-07 ENCOUNTER — Ambulatory Visit (INDEPENDENT_AMBULATORY_CARE_PROVIDER_SITE_OTHER): Payer: Medicare Other | Admitting: Cardiology

## 2019-04-07 ENCOUNTER — Encounter: Payer: Self-pay | Admitting: Cardiology

## 2019-04-07 VITALS — BP 150/88 | HR 65 | Ht 70.0 in | Wt 192.0 lb

## 2019-04-07 DIAGNOSIS — Z13220 Encounter for screening for lipoid disorders: Secondary | ICD-10-CM

## 2019-04-07 DIAGNOSIS — Z7901 Long term (current) use of anticoagulants: Secondary | ICD-10-CM | POA: Diagnosis not present

## 2019-04-07 DIAGNOSIS — I2510 Atherosclerotic heart disease of native coronary artery without angina pectoris: Secondary | ICD-10-CM

## 2019-04-07 DIAGNOSIS — I10 Essential (primary) hypertension: Secondary | ICD-10-CM | POA: Diagnosis not present

## 2019-04-07 DIAGNOSIS — Z79899 Other long term (current) drug therapy: Secondary | ICD-10-CM

## 2019-04-07 DIAGNOSIS — I480 Paroxysmal atrial fibrillation: Secondary | ICD-10-CM

## 2019-04-07 LAB — LIPID PANEL
Chol/HDL Ratio: 4 ratio (ref 0.0–5.0)
Cholesterol, Total: 210 mg/dL — ABNORMAL HIGH (ref 100–199)
HDL: 52 mg/dL (ref >39)
LDL Calculated: 128 mg/dL — ABNORMAL HIGH (ref 0–99)
Triglycerides: 152 mg/dL — ABNORMAL HIGH (ref 0–149)
VLDL Cholesterol Cal: 30 mg/dL (ref 5–40)

## 2019-04-07 LAB — COMPREHENSIVE METABOLIC PANEL
ALT: 27 IU/L (ref 0–44)
AST: 29 IU/L (ref 0–40)
Albumin/Globulin Ratio: 1.9 (ref 1.2–2.2)
Albumin: 4.3 g/dL (ref 3.7–4.7)
Alkaline Phosphatase: 76 IU/L (ref 39–117)
BUN/Creatinine Ratio: 14 (ref 10–24)
BUN: 16 mg/dL (ref 8–27)
Bilirubin Total: 0.7 mg/dL (ref 0.0–1.2)
CO2: 25 mmol/L (ref 20–29)
Calcium: 9.3 mg/dL (ref 8.6–10.2)
Chloride: 101 mmol/L (ref 96–106)
Creatinine, Ser: 1.11 mg/dL (ref 0.76–1.27)
GFR calc Af Amer: 76 mL/min/{1.73_m2} (ref >59)
GFR calc non Af Amer: 66 mL/min/{1.73_m2} (ref >59)
Globulin, Total: 2.3 g/dL (ref 1.5–4.5)
Glucose: 116 mg/dL — ABNORMAL HIGH (ref 65–99)
Potassium: 5 mmol/L (ref 3.5–5.2)
Sodium: 139 mmol/L (ref 134–144)
Total Protein: 6.6 g/dL (ref 6.0–8.5)

## 2019-04-07 NOTE — Telephone Encounter (Signed)
Lab orders for a lipid panel and CMP were placed during office visit with Dr. Bettina Gavia today, 04/07/2019.

## 2019-04-07 NOTE — Patient Instructions (Addendum)
Medication Instructions:  Your physician recommends that you continue on your current medications as directed. Please refer to the Current Medication list given to you today.  If you need a refill on your cardiac medications before your next appointment, please call your pharmacy.   Lab work: Your physician recommends that you return for lab work today: lipid panel, CMP.   If you have labs (blood work) drawn today and your tests are completely normal, you will receive your results only by: Marland Kitchen MyChart Message (if you have MyChart) OR . A paper copy in the mail If you have any lab test that is abnormal or we need to change your treatment, we will call you to review the results.  Testing/Procedures: None  Follow-Up: At [facility], you and your health needs are our priority.  As part of our continuing mission to provide you with exceptional heart care, we have created designated Provider Care Teams.  These Care Teams include your primary Cardiologist (physician) and Advanced Practice Providers (APPs -  Physician Assistants and Nurse Practitioners) who all work together to provide you with the care you need, when you need it. You will need a follow up appointment in 6 months.  Please call our office 2 months in advance to schedule this appointment.

## 2019-04-14 DIAGNOSIS — I2510 Atherosclerotic heart disease of native coronary artery without angina pectoris: Secondary | ICD-10-CM | POA: Diagnosis not present

## 2019-04-30 ENCOUNTER — Telehealth: Payer: Self-pay

## 2019-04-30 DIAGNOSIS — I2510 Atherosclerotic heart disease of native coronary artery without angina pectoris: Secondary | ICD-10-CM

## 2019-04-30 MED ORDER — ROSUVASTATIN CALCIUM 10 MG PO TABS: 10.0000 mg | ORAL_TABLET | Freq: Every day | ORAL | 1 refills | Status: DC

## 2019-04-30 NOTE — Telephone Encounter (Signed)
Patient advised that Dr Bettina Gavia would like him to start rosuvastatin 10mg  daily and recheck Lipids in 6 to 8 weeks after starting statin.  Rx sent to pharmacy.  Patient agreed to plan and verbalized understanding. No further questions.

## 2019-04-30 NOTE — Telephone Encounter (Signed)
-----   Message from Richardo Priest, MD sent at 04/28/2019  2:22 PM EDT ----- I would like him to start a statin rosuvastatin 10 mg daily we will do a follow-up lipid profile in 6 to 8 weeks

## 2019-05-05 ENCOUNTER — Other Ambulatory Visit: Payer: Self-pay

## 2019-05-05 ENCOUNTER — Encounter (HOSPITAL_COMMUNITY): Payer: Self-pay | Admitting: Physician Assistant

## 2019-05-05 ENCOUNTER — Ambulatory Visit (HOSPITAL_COMMUNITY)
Admission: RE | Admit: 2019-05-05 | Discharge: 2019-05-05 | Disposition: A | Discharge status: Home / Self Care | Payer: Medicare Other | Source: Ambulatory Visit | Attending: Physician Assistant | Admitting: Physician Assistant

## 2019-05-05 VITALS — BP 142/84 | HR 65 | Ht 70.0 in | Wt 191.0 lb

## 2019-05-05 DIAGNOSIS — I4892 Unspecified atrial flutter: Secondary | ICD-10-CM | POA: Insufficient documentation

## 2019-05-05 DIAGNOSIS — Z7901 Long term (current) use of anticoagulants: Secondary | ICD-10-CM | POA: Diagnosis not present

## 2019-05-05 DIAGNOSIS — Z79899 Other long term (current) drug therapy: Secondary | ICD-10-CM | POA: Insufficient documentation

## 2019-05-05 DIAGNOSIS — I10 Essential (primary) hypertension: Secondary | ICD-10-CM | POA: Insufficient documentation

## 2019-05-05 DIAGNOSIS — I480 Paroxysmal atrial fibrillation: Secondary | ICD-10-CM | POA: Insufficient documentation

## 2019-05-05 DIAGNOSIS — Z8249 Family history of ischemic heart disease and other diseases of the circulatory system: Secondary | ICD-10-CM | POA: Insufficient documentation

## 2019-05-05 DIAGNOSIS — I2510 Atherosclerotic heart disease of native coronary artery without angina pectoris: Secondary | ICD-10-CM | POA: Insufficient documentation

## 2019-05-05 DIAGNOSIS — Z87891 Personal history of nicotine dependence: Secondary | ICD-10-CM | POA: Insufficient documentation

## 2019-05-05 NOTE — Progress Notes (Signed)
Primary Care Physician: Lawerance Cruel, MD Primary Cardiologist: Dr Bettina Gavia Primary Electrophysiologist: Dr Curt Bears Referring Physician: Dr Oneal Grout is a 73 y.o. male with a history of HTN, paroxysmal atrial fibrillation, and atrial flutter who presents for follow up in the [facility]. Patient recently underwent afib and atrial flutter ablation with Dr Curt Bears on 04/03/19. Patient reports that he has done well with no symptoms of afib. He has a Chiropodist which he checks regularly. This has shown all SR since the ablation. He denies CP, swallowing, or groin issues. He does have right leg soreness but this has been chronic for him prior to the ablation.   Today, he denies symptoms of palpitations, chest pain, shortness of breath, orthopnea, PND, lower extremity edema, dizziness, presyncope, syncope, snoring, daytime somnolence, bleeding, or neurologic sequela. The patient is tolerating medications without difficulties and is otherwise without complaint today.    Atrial Fibrillation Risk Factors:  he does not have symptoms or diagnosis of sleep apnea.  he has a BMI of Body mass index is 27.41 kg/m.Marland Kitchen Filed Weights   05/05/19 1108  Weight: 86.6 kg    Family History  Problem Relation Age of Onset  . CAD Mother   . Hyperlipidemia Mother   . Heart attack Father        @37  & @62      Atrial Fibrillation Management history:  Previous antiarrhythmic drugs: amiodarone Previous cardioversions: none Previous ablations: 04/03/19 (fib and flutter) CHADS2VASC score: 3 Anticoagulation history: Eliquis   Past Medical History:  Diagnosis Date  . Hypertension    Past Surgical History:  Procedure Laterality Date  . ATRIAL FIBRILLATION ABLATION N/A 04/03/2019   Procedure: ATRIAL FIBRILLATION ABLATION;  Surgeon: Constance Haw, MD;  Location: [place] [facility];  Service: Cardiovascular;  Laterality: N/A;  . CHOLECYSTECTOMY    .  TONSILLECTOMY      Current Outpatient Medications  Medication Sig Dispense Refill  . amiodarone (PACERONE) 200 MG tablet Take 0.5 tablets (100 mg total) by mouth daily. 45 tablet 1  . apixaban (ELIQUIS) 5 MG TABS tablet Take 1 tablet (5 mg total) by mouth 2 (two) times daily. 180 tablet 1  . Metoprolol Succinate 25 MG CS24 Take 1 tablet by mouth daily.    . Multiple Vitamin (MULTIVITAMIN WITH MINERALS) TABS tablet Take 1 tablet by mouth daily. Adult Multivitamin Centrum 50+    . Omega-3 Fatty Acids (FISH OIL ULTRA) 1400 MG CAPS Take 1,400 mg by mouth daily.    Marland Kitchen omeprazole (PRILOSEC OTC) 20 MG tablet Take 10 mg by mouth daily before breakfast.    . polyvinyl alcohol (LIQUIFILM TEARS) 1.4 % ophthalmic solution Place 1 drop into both eyes every 4 (four) hours as needed for dry eyes.    . rosuvastatin (CRESTOR) 10 MG tablet Take 1 tablet (10 mg total) by mouth daily. 30 tablet 1  . terbinafine (LAMISIL) 250 MG tablet Take 250 mg by mouth See admin instructions. Take 1 tablet (250 mg) by mouth once a day for 1 week out of each month, then off for 3 weeks.  2   No current facility-administered medications for this encounter.     Allergies  Allergen Reactions  . Multaq [Dronedarone]     Abdominal pain and diarrhea    Social History   Socioeconomic History  . Marital status: Married    Spouse name: Not on file  . Number of children: Not on file  . Years  of education: Not on file  . Highest education level: Not on file  Occupational History  . Not on file  Social Needs  . Financial resource strain: Not on file  . Food insecurity    Worry: Not on file    Inability: Not on file  . Transportation needs    Medical: Not on file    Non-medical: Not on file  Tobacco Use  . Smoking status: Former Smoker    Packs/day: 2.00    Years: 10.00    Pack years: 20.00    Types: Cigarettes    Quit date: 1976    Years since quitting: 44.7  . Smokeless tobacco: Never Used  Substance and Sexual  Activity  . Alcohol use: Yes    Alcohol/week: 1.0 standard drinks    Types: 1 Cans of beer per week    Comment: 1 beer every 2 -3 days  . Drug use: Not Currently  . Sexual activity: Not on file  Lifestyle  . Physical activity    Days per week: Not on file    Minutes per session: Not on file  . Stress: Not on file  Relationships  . Social Herbalist on phone: Not on file    Gets together: Not on file    Attends religious service: Not on file    Active member of club or organization: Not on file    Attends meetings of clubs or organizations: Not on file    Relationship status: Not on file  . Intimate partner violence    Fear of current or ex partner: Not on file    Emotionally abused: Not on file    Physically abused: Not on file    Forced sexual activity: Not on file  Other Topics Concern  . Not on file  Social History Narrative  . Not on file     ROS- All systems are reviewed and negative except as per the HPI above.  Physical Exam: Vitals:   05/05/19 1108  BP: (!) 142/84  Pulse: 65  Weight: 86.6 kg  Height: 5\' 10"  (1.778 m)    GEN- The patient is well appearing, alert and oriented x 3 today.   Head- normocephalic, atraumatic Eyes-  Sclera clear, conjunctiva pink Ears- hearing intact Oropharynx- clear Neck- supple  Lungs- Clear to ausculation bilaterally, normal work of breathing Heart- Regular rate and rhythm, no murmurs, rubs or gallops  GI- soft, NT, ND, + BS Extremities- no clubbing, cyanosis, or edema MS- no significant deformity or atrophy Skin- no rash or lesion Psych- euthymic mood, full affect Neuro- strength and sensation are intact  Wt Readings from Last 3 Encounters:  05/05/19 86.6 kg  04/07/19 87.1 kg  04/03/19 86.2 kg    EKG today demonstrates SR HR 65, PR 142, QRS 80, QTc 428  Echo 05/12/18 demonstrated  - Left ventricle: The cavity size was normal. Wall thickness was   increased in a pattern of mild LVH. Systolic function  was normal.   The estimated ejection fraction was in the range of 55% to 60%.   Wall motion was normal; there were no regional wall motion   abnormalities.  Impressions:  - Normal LVEF.   Normal LA size.   Trace TR, MR.  Epic records are reviewed at length today  Assessment and Plan:  1. Paroxysmal atrial fibrillation/atrial flutter S/p afib and aflutter ablation with Dr Curt Bears 04/03/19. Patient appears to be maintaining SR. Continue Eliquis 5 mg BID for  at least 3 months post procedure with no missed doses. Continue amiodarone 100 mg daily for now. May be able to discontinue on follow up. Continue Toprol 25 mg daily Kardia device for routine monitoring.   This patients CHA2DS2-VASc Score and unadjusted Ischemic Stroke Rate (% per year) is equal to 3.2 % stroke rate/year from a score of 3  Above score calculated as 1 point each if present [CHF, HTN, DM, Vascular=MI/PAD/Aortic Plaque, Age if 65-74, or Male] Above score calculated as 2 points each if present [Age > 75, or Stroke/TIA/TE]   2. HTN Stable, no changes today.  3. CAD CTA showed CAC score of 307 and moedrate proximal LAD stenosis 51-69%. No anginal symptoms. Continue present therapy and risk factor modification.    Follow up with Dr Curt Bears as scheduled.    [facility] [address] [place], [place] [address] [phone] 05/05/2019 11:30 AM

## 2019-06-19 ENCOUNTER — Other Ambulatory Visit: Payer: Self-pay | Admitting: Cardiology

## 2019-06-21 ENCOUNTER — Other Ambulatory Visit: Payer: Self-pay | Admitting: Cardiology

## 2019-06-29 ENCOUNTER — Encounter: Payer: Self-pay | Admitting: Cardiology

## 2019-06-29 ENCOUNTER — Ambulatory Visit (INDEPENDENT_AMBULATORY_CARE_PROVIDER_SITE_OTHER): Payer: Medicare Other | Admitting: Cardiology

## 2019-06-29 ENCOUNTER — Other Ambulatory Visit: Payer: Self-pay

## 2019-06-29 VITALS — BP 144/82 | HR 66 | Ht 70.0 in | Wt 189.0 lb

## 2019-06-29 DIAGNOSIS — I480 Paroxysmal atrial fibrillation: Secondary | ICD-10-CM | POA: Diagnosis not present

## 2019-06-29 NOTE — Addendum Note (Signed)
Addended by: Stanton Kidney on: 06/29/2019 04:07 PM   Modules accepted: Orders

## 2019-06-29 NOTE — Progress Notes (Signed)
Electrophysiology Office Note   Date:  06/29/2019   ID:  Gregory, Vasquez 04-15-1946, MRN [id number]  PCP:  Gregory Cruel, Vasquez  Cardiologist:  [facility] Primary Electrophysiologist:  Dr Curt Bears    CC: Evaluation for atrial fibrillation/atrial flutter   History of Present Illness: Gregory Vasquez is a 73 y.o. male who is being seen today for the evaluation of atrial fibrillation at the request of Gregory Cruel, Vasquez. Presenting today for electrophysiology evaluation.  He has a history of paroxymal atrial fibrillation and typical atrial flutter.  He was previously intolerant of Multaq 2/2 GI side effects.  He was started on flecainide but he had several episodes of rapid atrial flutter and this was discontinued as well. At his last visit with Dr Gregory Vasquez, his BB was increased and he has felt better overall. He has symptomatic episodes of atrial fibrillation with fatigue and palpitations about once every 2 weeks which last for several hours.  He is now status post AF ablation 04/03/2019.  Today, denies symptoms of palpitations, chest pain, shortness of breath, orthopnea, PND, lower extremity edema, claudication, dizziness, presyncope, syncope, bleeding, or neurologic sequela. The patient is tolerating medications without difficulties.  Overall he is doing well.  He is noted no further episodes of atrial fibrillation.   Past Medical History:  Diagnosis Date  . Hypertension    Past Surgical History:  Procedure Laterality Date  . ATRIAL FIBRILLATION ABLATION N/A 04/03/2019   Procedure: ATRIAL FIBRILLATION ABLATION;  Surgeon: Constance Haw, Vasquez;  Location: [place] [facility];  Service: Cardiovascular;  Laterality: N/A;  . CHOLECYSTECTOMY    . TONSILLECTOMY       Current Outpatient Medications  Medication Sig Dispense Refill  . amiodarone (PACERONE) 200 MG tablet Take 0.5 tablets (100 mg total) by mouth daily. 45 tablet 1  . ELIQUIS 5 MG TABS tablet Take 1 tablet by mouth twice  daily 180 tablet 1  . Metoprolol Succinate 25 MG CS24 Take 1 tablet by mouth daily.    . Multiple Vitamin (MULTIVITAMIN WITH MINERALS) TABS tablet Take 1 tablet by mouth daily. Adult Multivitamin Centrum 50+    . Omega-3 Fatty Acids (FISH OIL ULTRA) 1400 MG CAPS Take 1,400 mg by mouth daily.    Marland Kitchen omeprazole (PRILOSEC OTC) 20 MG tablet Take 10 mg by mouth daily before breakfast.    . polyvinyl alcohol (LIQUIFILM TEARS) 1.4 % ophthalmic solution Place 1 drop into both eyes every 4 (four) hours as needed for dry eyes.    . rosuvastatin (CRESTOR) 10 MG tablet Take 1 tablet by mouth once daily 30 tablet 6  . terbinafine (LAMISIL) 250 MG tablet Take 250 mg by mouth See admin instructions. Take 1 tablet (250 mg) by mouth once a day for 1 week out of each month, then off for 3 weeks.  2   No current facility-administered medications for this visit.     Allergies:   Multaq [dronedarone]   Social History:  The patient  reports that he quit smoking about 44 years ago. His smoking use included cigarettes. He has a 20.00 pack-year smoking history. He has never used smokeless tobacco. He reports current alcohol use of about 1.0 standard drinks of alcohol per week. He reports previous drug use.   Family History:  The patient's family history includes CAD in his mother; Heart attack in his father; Hyperlipidemia in his mother.    ROS:  Please see the history of present illness.   Otherwise, review of systems  is positive for none.   All other systems are reviewed and negative.   PHYSICAL EXAM: VS:  BP (!) 144/82   Pulse 66   Ht 5\' 10"  (1.778 m)   Wt 189 lb (85.7 kg)   SpO2 98%   BMI 27.12 kg/m  , BMI Body mass index is 27.12 kg/m. GEN: Well nourished, well developed, in no acute distress  HEENT: normal  Neck: no JVD, carotid bruits, or masses Cardiac: RRR; no murmurs, rubs, or gallops,no edema  Respiratory:  clear to auscultation bilaterally, normal work of breathing GI: soft, nontender,  nondistended, + BS MS: no deformity or atrophy  Skin: warm and dry Neuro:  Strength and sensation are intact Psych: euthymic mood, full affect  EKG:  EKG is ordered today. Personal review of the ekg ordered shows sinus rhythm, rate 66  Recent Labs: 01/07/2019: TSH 3.550 03/27/2019: Hemoglobin 14.7; Platelets 222 04/07/2019: ALT 27; BUN 16; Creatinine, Ser 1.11; Potassium 5.0; Sodium 139    Lipid Panel     Component Value Date/Time   CHOL 210 (H) 04/07/2019 1119   TRIG 152 (H) 04/07/2019 1119   HDL 52 04/07/2019 1119   CHOLHDL 4.0 04/07/2019 1119   LDLCALC 128 (H) 04/07/2019 1119     Wt Readings from Last 3 Encounters:  06/29/19 189 lb (85.7 kg)  05/05/19 191 lb (86.6 kg)  04/07/19 192 lb (87.1 kg)      Other studies Reviewed: Additional studies/ records that were reviewed today include: TTE 05/12/18  Review of the above records today demonstrates:  - Normal LVEF.   Normal LA size.   Trace TR, MR.   ASSESSMENT AND PLAN:  1.  Paroxysmal atrial fibrillation/atrial flutter: Currently on metoprolol, amiodarone and Eliquis.  He is remained in sinus rhythm.  We Xaivier Malay therefore stop amiodarone.  This patients CHA2DS2-VASc Score and unadjusted Ischemic Stroke Rate (% per year) is equal to 2.2 % stroke rate/year from a score of 2  Above score calculated as 1 point each if present [CHF, HTN, DM, Vascular=MI/PAD/Aortic Plaque, Age if 65-74, or Male] Above score calculated as 2 points each if present [Age > 75, or Stroke/TIA/TE]   2. HTN: Mildly elevated today but is normally 120s.  No changes.   Current medicines are reviewed at length with the patient today.   The patient does not have concerns regarding his medicines.  The following changes were made today: Stop amiodarone  Labs/ tests ordered today include:  Orders Placed This Encounter  Procedures  . EKG 12-Lead     Disposition:   FU with Tonette Koehne 3 months  Signed, Iridian Reader Meredith Leeds, Vasquez  06/29/2019 3:53  PM     [facility] [facility] [address] [place] [address] [phone] (office) [phone] (fax)

## 2019-06-29 NOTE — Patient Instructions (Addendum)
Medication Instructions:  Your physician has recommended you make the following change in your medication:  1. STOP Amiodarone  * If you need a refill on your cardiac medications before your next appointment, please call your pharmacy.   Labwork: None ordered  Testing/Procedures: None ordered  Follow-Up: Your physician recommends that you schedule a follow-up appointment in: 3 months with Dr. Curt Bears.  Thank you for choosing [facility]!!   Trinidad Curet, RN [phone]

## 2019-07-13 ENCOUNTER — Ambulatory Visit: Payer: Medicare Other | Admitting: Cardiology

## 2019-07-29 DIAGNOSIS — Z0000 Encounter for general adult medical examination without abnormal findings: Secondary | ICD-10-CM | POA: Diagnosis not present

## 2019-07-29 DIAGNOSIS — M25559 Pain in unspecified hip: Secondary | ICD-10-CM | POA: Diagnosis not present

## 2019-07-29 DIAGNOSIS — M179 Osteoarthritis of knee, unspecified: Secondary | ICD-10-CM | POA: Diagnosis not present

## 2019-07-29 DIAGNOSIS — Z79899 Other long term (current) drug therapy: Secondary | ICD-10-CM | POA: Diagnosis not present

## 2019-08-04 DIAGNOSIS — I10 Essential (primary) hypertension: Secondary | ICD-10-CM | POA: Diagnosis not present

## 2019-08-04 DIAGNOSIS — M179 Osteoarthritis of knee, unspecified: Secondary | ICD-10-CM | POA: Diagnosis not present

## 2019-08-04 DIAGNOSIS — M25559 Pain in unspecified hip: Secondary | ICD-10-CM | POA: Diagnosis not present

## 2019-08-04 DIAGNOSIS — D509 Iron deficiency anemia, unspecified: Secondary | ICD-10-CM | POA: Diagnosis not present

## 2019-08-11 DIAGNOSIS — M1611 Unilateral primary osteoarthritis, right hip: Secondary | ICD-10-CM | POA: Diagnosis not present

## 2019-08-11 DIAGNOSIS — M25561 Pain in right knee: Secondary | ICD-10-CM | POA: Diagnosis not present

## 2019-09-21 ENCOUNTER — Other Ambulatory Visit: Payer: Self-pay

## 2019-09-21 ENCOUNTER — Ambulatory Visit (INDEPENDENT_AMBULATORY_CARE_PROVIDER_SITE_OTHER): Payer: Medicare Other | Admitting: Cardiology

## 2019-09-21 ENCOUNTER — Encounter: Payer: Self-pay | Admitting: Cardiology

## 2019-09-21 VITALS — BP 122/76 | HR 76 | Ht 70.0 in | Wt 194.1 lb

## 2019-09-21 DIAGNOSIS — I480 Paroxysmal atrial fibrillation: Secondary | ICD-10-CM | POA: Diagnosis not present

## 2019-09-21 NOTE — Progress Notes (Signed)
Electrophysiology Office Note   Date:  09/21/2019   ID:  Tiquan, Bendel Dec 12, 1945, MRN [id number]  PCP:  Lawerance Cruel, MD  Cardiologist:  [facility] Primary Electrophysiologist:  Dr Curt Bears    CC: Evaluation for atrial fibrillation/atrial flutter   History of Present Illness: Gregory Vasquez is a 74 y.o. male who is being seen today for the evaluation of atrial fibrillation at the request of Lawerance Cruel, MD. Presenting today for electrophysiology evaluation.  He has a history of paroxymal atrial fibrillation and typical atrial flutter.  He was previously intolerant of Multaq 2/2 GI side effects.  He was started on flecainide but he had several episodes of rapid atrial flutter and this was discontinued as well.  He has symptomatic episodes of atrial fibrillation with fatigue and palpitations about once every 2 weeks which last for several hours.  He is now status post AF ablation 04/03/2019.  Today, denies symptoms of palpitations, chest pain, shortness of breath, orthopnea, PND, lower extremity edema, claudication, dizziness, presyncope, syncope, bleeding, or neurologic sequela. The patient is tolerating medications without difficulties.  Overall he is doing well.  He is noted no episodes of atrial fibrillation since ablation.   Past Medical History:  Diagnosis Date  . Hypertension    Past Surgical History:  Procedure Laterality Date  . ATRIAL FIBRILLATION ABLATION N/A 04/03/2019   Procedure: ATRIAL FIBRILLATION ABLATION;  Surgeon: Constance Haw, MD;  Location: [place] [facility];  Service: Cardiovascular;  Laterality: N/A;  . CHOLECYSTECTOMY    . TONSILLECTOMY       Current Outpatient Medications  Medication Sig Dispense Refill  . ELIQUIS 5 MG TABS tablet Take 1 tablet by mouth twice daily 180 tablet 1  . Metoprolol Succinate 25 MG CS24 Take 1 tablet by mouth daily.    . Multiple Vitamin (MULTIVITAMIN WITH MINERALS) TABS tablet Take 1 tablet by mouth daily.  Adult Multivitamin Centrum 50+    . Omega-3 Fatty Acids (FISH OIL ULTRA) 1400 MG CAPS Take 1,400 mg by mouth daily.    Marland Kitchen omeprazole (PRILOSEC OTC) 20 MG tablet Take 10 mg by mouth daily before breakfast.    . polyvinyl alcohol (LIQUIFILM TEARS) 1.4 % ophthalmic solution Place 1 drop into both eyes every 4 (four) hours as needed for dry eyes.    . rosuvastatin (CRESTOR) 10 MG tablet Take 1 tablet by mouth once daily 30 tablet 6   No current facility-administered medications for this visit.    Allergies:   Multaq [dronedarone]   Social History:  The patient  reports that he quit smoking about 45 years ago. His smoking use included cigarettes. He has a 20.00 pack-year smoking history. He has never used smokeless tobacco. He reports current alcohol use of about 1.0 standard drinks of alcohol per week. He reports previous drug use.   Family History:  The patient's family history includes CAD in his mother; Heart attack in his father; Hyperlipidemia in his mother.    ROS:  Please see the history of present illness.   Otherwise, review of systems is positive for none.   All other systems are reviewed and negative.   PHYSICAL EXAM: VS:  BP 122/76   Pulse 76   Ht 5\' 10"  (1.778 m)   Wt 194 lb 1.3 oz (88 kg)   SpO2 96%   BMI 27.85 kg/m  , BMI Body mass index is 27.85 kg/m. GEN: Well nourished, well developed, in no acute distress  HEENT: normal  Neck: no  JVD, carotid bruits, or masses Cardiac: RRR; no murmurs, rubs, or gallops,no edema  Respiratory:  clear to auscultation bilaterally, normal work of breathing GI: soft, nontender, nondistended, + BS MS: no deformity or atrophy  Skin: warm and dry Neuro:  Strength and sensation are intact Psych: euthymic mood, full affect  EKG:  EKG is ordered today. Personal review of the ekg ordered shows sinus rhythm  Recent Labs: 01/07/2019: TSH 3.550 03/27/2019: Hemoglobin 14.7; Platelets 222 04/07/2019: ALT 27; BUN 16; Creatinine, Ser 1.11; Potassium  5.0; Sodium 139    Lipid Panel     Component Value Date/Time   CHOL 210 (H) 04/07/2019 1119   TRIG 152 (H) 04/07/2019 1119   HDL 52 04/07/2019 1119   CHOLHDL 4.0 04/07/2019 1119   LDLCALC 128 (H) 04/07/2019 1119     Wt Readings from Last 3 Encounters:  09/21/19 194 lb 1.3 oz (88 kg)  06/29/19 189 lb (85.7 kg)  05/05/19 191 lb (86.6 kg)      Other studies Reviewed: Additional studies/ records that were reviewed today include: TTE 05/12/18  Review of the above records today demonstrates:  - Normal LVEF.   Normal LA size.   Trace TR, MR.   ASSESSMENT AND PLAN:  1.  Paroxysmal atrial fibrillation/atrial flutter: Currently on metoprolol and Eliquis.  CHA2DS2-VASc 2.  Is remained in sinus rhythm.  He wishes to stop medication today.  Shelia Magallon stop metoprolol.   2. HTN: Currently well controlled   Current medicines are reviewed at length with the patient today.   The patient does not have concerns regarding his medicines.  The following changes were made today: Stop metoprolol  Labs/ tests ordered today include:  Orders Placed This Encounter  Procedures  . EKG 12-Lead     Disposition:   FU with Quamaine Webb 6 months  Signed, Creed Kail Meredith Leeds, MD  09/21/2019 2:13 PM     [facility] [address] [place] [address] [phone] (office) [phone] (fax)

## 2020-01-07 DIAGNOSIS — M1611 Unilateral primary osteoarthritis, right hip: Secondary | ICD-10-CM | POA: Diagnosis not present

## 2020-01-07 DIAGNOSIS — M1711 Unilateral primary osteoarthritis, right knee: Secondary | ICD-10-CM | POA: Diagnosis not present

## 2020-01-10 ENCOUNTER — Other Ambulatory Visit: Payer: Self-pay | Admitting: Cardiology

## 2020-01-11 NOTE — Telephone Encounter (Signed)
LOV with Dr. Curt Bears was 10/11/2019

## 2020-01-24 ENCOUNTER — Other Ambulatory Visit: Payer: Self-pay | Admitting: Cardiology

## 2020-01-27 ENCOUNTER — Other Ambulatory Visit: Payer: Self-pay

## 2020-01-27 ENCOUNTER — Ambulatory Visit: Payer: Medicare Other | Admitting: Cardiology

## 2020-01-27 ENCOUNTER — Encounter: Payer: Self-pay | Admitting: Cardiology

## 2020-01-27 VITALS — BP 138/92 | HR 90 | Ht 70.0 in | Wt 188.0 lb

## 2020-01-27 DIAGNOSIS — I480 Paroxysmal atrial fibrillation: Secondary | ICD-10-CM

## 2020-01-27 DIAGNOSIS — E782 Mixed hyperlipidemia: Secondary | ICD-10-CM

## 2020-01-27 DIAGNOSIS — I2510 Atherosclerotic heart disease of native coronary artery without angina pectoris: Secondary | ICD-10-CM

## 2020-01-27 DIAGNOSIS — Z7901 Long term (current) use of anticoagulants: Secondary | ICD-10-CM | POA: Diagnosis not present

## 2020-01-27 DIAGNOSIS — I10 Essential (primary) hypertension: Secondary | ICD-10-CM | POA: Diagnosis not present

## 2020-01-27 MED ORDER — QUINAPRIL HCL 20 MG PO TABS: 20.0000 mg | ORAL_TABLET | Freq: Every day | ORAL | 3 refills | Status: DC

## 2020-01-27 NOTE — Progress Notes (Signed)
Cardiology Office Note:    Date:  01/27/2020   ID:  Gregory Vasquez, DOB 02-07-1946, MRN [id number]  PCP:  Lawerance Cruel, MD  Cardiologist:  Shirlee More, MD    Referring MD: Lawerance Cruel, MD    ASSESSMENT:    1. Paroxysmal atrial fibrillation (HCC)   2. Chronic anticoagulation   3. CAD in native artery   4. Essential hypertension   5. Mixed hyperlipidemia    PLAN:    In order of problems listed above:  1. Stable maintaining sinus rhythm after pulmonary vein isolation who remain anticoagulated. 2. Stable CAD no anginal discomfort on current medical therapy New York Heart Association class I and he will continue his statin but reduced intensity.  At this time does not require repeat ischemia evaluation 3. Hypertension is trending above target we will restart ACE inhibitor will monitor home blood pressure and tell me if it remains greater than 140/90 4. Reduce his statin to 2 days a week check lipid profile liver function today   Next appointment: 6 months   Medication Adjustments/Labs and Tests Ordered: Current medicines are reviewed at length with the patient today.  Concerns regarding medicines are outlined above.  Orders Placed This Encounter  Procedures  . Comprehensive metabolic panel  . Lipid panel   Meds ordered this encounter  Medications  . quinapril (ACCUPRIL) 20 MG tablet    Sig: Take 1 tablet (20 mg total) by mouth at bedtime.    Dispense:  90 tablet    Refill:  3    Chief Complaint  Patient presents with  . Follow-up    6 MO FU     History of Present Illness:    Gregory Vasquez is a 74 y.o. male with a hx of PAF, HTN. He had an atrial fibrillation EP catheter ablation on 04/03/19. CTA showed CAC score of 307 and moedrate proximal LAD stenosis 51-69%  He was last seen 04/07/2019.  EP discontinued his beta-blocker at his office follow-up 09/21/2019 compliance with diet, lifestyle and medications: Yes  Fortunately has had no recurrent atrial  fibrillation no palpitation or syncope and tolerates his anticoagulant without bleeding He notices home blood pressures running greater than 140/90 request to restart his ACE inhibitor. He has increasing joint and muscle pain he will  stop his statin for 2 weeks and then place him on twice weekly rosuvastatin He has stable CAD and has had no anginal discomfort Past Medical History:  Diagnosis Date  . Hypertension     Past Surgical History:  Procedure Laterality Date  . ATRIAL FIBRILLATION ABLATION N/A 04/03/2019   Procedure: ATRIAL FIBRILLATION ABLATION;  Surgeon: Constance Haw, MD;  Location: [facility];  Service: Cardiovascular;  Laterality: N/A;  . CHOLECYSTECTOMY    . TONSILLECTOMY      Current Medications: Current Meds  Medication Sig  . ELIQUIS 5 MG TABS tablet Take 1 tablet by mouth twice daily  . Multiple Vitamin (MULTIVITAMIN WITH MINERALS) TABS tablet Take 1 tablet by mouth daily. Adult Multivitamin Centrum 50+  . Omega-3 Fatty Acids (FISH OIL ULTRA) 1400 MG CAPS Take 1,400 mg by mouth daily.  Marland Kitchen omeprazole (PRILOSEC OTC) 20 MG tablet Take 10 mg by mouth daily before breakfast.  . polyvinyl alcohol (LIQUIFILM TEARS) 1.4 % ophthalmic solution Place 1 drop into both eyes every 4 (four) hours as needed for dry eyes.  . rosuvastatin (CRESTOR) 10 MG tablet Take 1 tablet by mouth once daily     Allergies:  Multaq [dronedarone]   Social History   Socioeconomic History  . Marital status: Married    Spouse name: Not on file  . Number of children: Not on file  . Years of education: Not on file  . Highest education level: Not on file  Occupational History  . Not on file  Tobacco Use  . Smoking status: Former Smoker    Packs/day: 2.00    Years: 10.00    Pack years: 20.00    Types: Cigarettes    Quit date: 1976    Years since quitting: 45.4  . Smokeless tobacco: Never Used  Substance and Sexual Activity  . Alcohol use: Yes    Alcohol/week: 1.0 standard  drinks    Types: 1 Cans of beer per week    Comment: 1 beer every 2 -3 days  . Drug use: Not Currently  . Sexual activity: Not on file  Other Topics Concern  . Not on file  Social History Narrative  . Not on file   Social Determinants of Health   Financial Resource Strain:   . Difficulty of Paying Living Expenses:   Food Insecurity:   . Worried About Charity fundraiser in the Last Year:   . Arboriculturist in the Last Year:   Transportation Needs:   . Film/video editor (Medical):   Marland Kitchen Lack of Transportation (Non-Medical):   Physical Activity:   . Days of Exercise per Week:   . Minutes of Exercise per Session:   Stress:   . Feeling of Stress :   Social Connections:   . Frequency of Communication with Friends and Family:   . Frequency of Social Gatherings with Friends and Family:   . Attends Religious Services:   . Active Member of Clubs or Organizations:   . Attends Archivist Meetings:   Marland Kitchen Marital Status:      Family History: The patient's family history includes CAD in his mother; Heart attack in his father; Hyperlipidemia in his mother. ROS:   Please see the history of present illness.    All other systems reviewed and are negative.  EKGs/Labs/Other Studies Reviewed:    The following studies were reviewed today:  EKG:  EKG performed 09/21/2019 by EP shows sinus rhythm and is normal independently reviewed  Recent Labs: 03/27/2019: Hemoglobin 14.7; Platelets 222 04/07/2019: ALT 27; BUN 16; Creatinine, Ser 1.11; Potassium 5.0; Sodium 139  Recent Lipid Panel    Component Value Date/Time   CHOL 210 (H) 04/07/2019 1119   TRIG 152 (H) 04/07/2019 1119   HDL 52 04/07/2019 1119   CHOLHDL 4.0 04/07/2019 1119   LDLCALC 128 (H) 04/07/2019 1119    Physical Exam:    VS:  BP (!) 138/92 (BP Location: Left Arm, Patient Position: Sitting, Cuff Size: Normal)   Pulse 90   Ht 5\' 10"  (1.778 m)   Wt 188 lb (85.3 kg)   SpO2 99%   BMI 26.98 kg/m     Wt  Readings from Last 3 Encounters:  01/27/20 188 lb (85.3 kg)  09/21/19 194 lb 1.3 oz (88 kg)  06/29/19 189 lb (85.7 kg)     GEN:  Well nourished, well developed in no acute distress HEENT: Normal NECK: No JVD; No carotid bruits LYMPHATICS: No lymphadenopathy CARDIAC: RRR, no murmurs, rubs, gallops RESPIRATORY:  Clear to auscultation without rales, wheezing or rhonchi  ABDOMEN: Soft, non-tender, non-distended MUSCULOSKELETAL:  No edema; No deformity  SKIN: Warm and dry NEUROLOGIC:  Alert and  oriented x 3 PSYCHIATRIC:  Normal affect    Signed, Shirlee More, MD  01/27/2020 1:50 PM    [facility]

## 2020-01-27 NOTE — Patient Instructions (Signed)
Medication Instructions:  Your physician has recommended you make the following change in your medication:  START: Quinapril 20 mg take one tablet by mouth daily.   Stop your Rosuvastatin for 2 weeks and then restart it but take it twice weekly on Monday and Friday. *If you need a refill on your cardiac medications before your next appointment, please call your pharmacy*   Lab Work: Your physician recommends that you return for lab work in: [place], Lipids If you have labs (blood work) drawn today and your tests are completely normal, you will receive your results only by:  [place] (if you have MyChart) OR  A paper copy in the mail If you have any lab test that is abnormal or we need to change your treatment, we will call you to review the results.   Testing/Procedures: None   Follow-Up: At [facility], you and your health needs are our priority.  As part of our continuing mission to provide you with exceptional heart care, we have created designated Provider Care Teams.  These Care Teams include your primary Cardiologist (physician) and Advanced Practice Providers (APPs -  Physician Assistants and Nurse Practitioners) who all work together to provide you with the care you need, when you need it.  We recommend signing up for the patient portal called "MyChart".  Sign up information is provided on this After Visit Summary.  MyChart is used to connect with patients for Virtual Visits (Telemedicine).  Patients are able to view lab/test results, encounter notes, upcoming appointments, etc.  Non-urgent messages can be sent to your provider as well.   To learn more about what you can do with MyChart, go to [email or web address].    Your next appointment:   6 month(s)  The format for your next appointment:   In Person  Provider:   Shirlee More, MD   Other Instructions

## 2020-01-28 LAB — COMPREHENSIVE METABOLIC PANEL
ALT: 19 IU/L (ref 0–44)
AST: 20 IU/L (ref 0–40)
Albumin/Globulin Ratio: 1.6 (ref 1.2–2.2)
Albumin: 4.2 g/dL (ref 3.7–4.7)
Alkaline Phosphatase: 82 IU/L (ref 48–121)
BUN/Creatinine Ratio: 10 (ref 10–24)
BUN: 11 mg/dL (ref 8–27)
Bilirubin Total: 0.8 mg/dL (ref 0.0–1.2)
CO2: 21 mmol/L (ref 20–29)
Calcium: 9.6 mg/dL (ref 8.6–10.2)
Chloride: 105 mmol/L (ref 96–106)
Creatinine, Ser: 1.1 mg/dL (ref 0.76–1.27)
GFR calc Af Amer: 76 mL/min/{1.73_m2} (ref >59)
GFR calc non Af Amer: 66 mL/min/{1.73_m2} (ref >59)
Globulin, Total: 2.6 g/dL (ref 1.5–4.5)
Glucose: 118 mg/dL — ABNORMAL HIGH (ref 65–99)
Potassium: 4.5 mmol/L (ref 3.5–5.2)
Sodium: 140 mmol/L (ref 134–144)
Total Protein: 6.8 g/dL (ref 6.0–8.5)

## 2020-01-28 LAB — LIPID PANEL
Chol/HDL Ratio: 3.6 ratio (ref 0.0–5.0)
Cholesterol, Total: 180 mg/dL (ref 100–199)
HDL: 50 mg/dL (ref >39)
LDL Chol Calc (NIH): 104 mg/dL — ABNORMAL HIGH (ref 0–99)
Triglycerides: 150 mg/dL — ABNORMAL HIGH (ref 0–149)
VLDL Cholesterol Cal: 26 mg/dL (ref 5–40)

## 2020-02-18 DIAGNOSIS — H43811 Vitreous degeneration, right eye: Secondary | ICD-10-CM | POA: Diagnosis not present

## 2020-02-18 DIAGNOSIS — H35372 Puckering of macula, left eye: Secondary | ICD-10-CM | POA: Diagnosis not present

## 2020-02-18 DIAGNOSIS — H25813 Combined forms of age-related cataract, bilateral: Secondary | ICD-10-CM | POA: Diagnosis not present

## 2020-02-18 DIAGNOSIS — D3132 Benign neoplasm of left choroid: Secondary | ICD-10-CM | POA: Diagnosis not present

## 2020-02-26 DIAGNOSIS — S61511A Laceration without foreign body of right wrist, initial encounter: Secondary | ICD-10-CM | POA: Diagnosis not present

## 2020-02-26 DIAGNOSIS — W540XXA Bitten by dog, initial encounter: Secondary | ICD-10-CM | POA: Diagnosis not present

## 2020-03-08 DIAGNOSIS — M1611 Unilateral primary osteoarthritis, right hip: Secondary | ICD-10-CM | POA: Diagnosis not present

## 2020-03-11 DIAGNOSIS — S61511D Laceration without foreign body of right wrist, subsequent encounter: Secondary | ICD-10-CM | POA: Diagnosis not present

## 2020-04-04 ENCOUNTER — Ambulatory Visit: Payer: Medicare Other | Admitting: Cardiology

## 2020-04-04 ENCOUNTER — Encounter: Payer: Self-pay | Admitting: Cardiology

## 2020-04-04 ENCOUNTER — Other Ambulatory Visit: Payer: Self-pay

## 2020-04-04 VITALS — BP 124/80 | HR 83 | Ht 70.0 in | Wt 190.2 lb

## 2020-04-04 DIAGNOSIS — I480 Paroxysmal atrial fibrillation: Secondary | ICD-10-CM

## 2020-04-04 NOTE — Patient Instructions (Signed)
Medication Instructions:  Your physician recommends that you continue on your current medications as directed. Please refer to the Current Medication list given to you today.  *If you need a refill on your cardiac medications before your next appointment, please call your pharmacy*   Lab Work: None ordered If you have labs (blood work) drawn today and your tests are completely normal, you will receive your results only by: . MyChart Message (if you have MyChart) OR . A paper copy in the mail If you have any lab test that is abnormal or we need to change your treatment, we will call you to review the results.   Testing/Procedures: None ordered   Follow-Up: At [facility], you and your health needs are our priority.  As part of our continuing mission to provide you with exceptional heart care, we have created designated Provider Care Teams.  These Care Teams include your primary Cardiologist (physician) and Advanced Practice Providers (APPs -  Physician Assistants and Nurse Practitioners) who all work together to provide you with the care you need, when you need it.  We recommend signing up for the patient portal called "MyChart".  Sign up information is provided on this After Visit Summary.  MyChart is used to connect with patients for Virtual Visits (Telemedicine).  Patients are able to view lab/test results, encounter notes, upcoming appointments, etc.  Non-urgent messages can be sent to your provider as well.   To learn more about what you can do with MyChart, go to [email or web address].    Your next appointment:   6 month(s)  The format for your next appointment:   In Person  Provider:   Will Camnitz, MD   Thank you for choosing [facility]!!   Gerasimos Plotts, RN [phone]    Other Instructions    

## 2020-04-04 NOTE — Progress Notes (Signed)
Electrophysiology Office Note   Date:  04/04/2020   ID:  Nahum, Sherrer 1946-02-07, MRN [id number]  PCP:  Lawerance Cruel, MD  Cardiologist:  [facility] Primary Electrophysiologist:  Dr Curt Bears    CC: Evaluation for atrial fibrillation/atrial flutter   History of Present Illness: Gregory Vasquez is a 74 y.o. male who is being seen today for the evaluation of atrial fibrillation at the request of Lawerance Cruel, MD. Presenting today for electrophysiology evaluation.  He has a history of paroxymal atrial fibrillation and typical atrial flutter.  He was previously intolerant of Multaq 2/2 GI side effects.  He was started on flecainide but he had several episodes of rapid atrial flutter and this was discontinued as well.  He has symptomatic episodes of atrial fibrillation with fatigue and palpitations about once every 2 weeks which last for several hours.  He is now status post AF ablation 04/03/2019.  Today, denies symptoms of palpitations, chest pain, shortness of breath, orthopnea, PND, lower extremity edema, claudication, dizziness, presyncope, syncope, bleeding, or neurologic sequela. The patient is tolerating medications without difficulties.  He has been doing well.  He notes no further episodes of atrial fibrillation.  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  He has plans for hip replacement September 17.   Past Medical History:  Diagnosis Date  . Hypertension    Past Surgical History:  Procedure Laterality Date  . ATRIAL FIBRILLATION ABLATION N/A 04/03/2019   Procedure: ATRIAL FIBRILLATION ABLATION;  Surgeon: Constance Haw, MD;  Location: [facility];  Service: Cardiovascular;  Laterality: N/A;  . CHOLECYSTECTOMY    . TONSILLECTOMY       Current Outpatient Medications  Medication Sig Dispense Refill  . ELIQUIS 5 MG TABS tablet Take 1 tablet by mouth twice daily 180 tablet 1  . Metoprolol Succinate 25 MG CS24 Take 1 tablet by mouth daily.    .  Multiple Vitamin (MULTIVITAMIN WITH MINERALS) TABS tablet Take 1 tablet by mouth daily. Adult Multivitamin Centrum 50+    . Omega-3 Fatty Acids (FISH OIL ULTRA) 1400 MG CAPS Take 1,400 mg by mouth daily.    Marland Kitchen omeprazole (PRILOSEC OTC) 20 MG tablet Take 10 mg by mouth daily before breakfast.    . polyvinyl alcohol (LIQUIFILM TEARS) 1.4 % ophthalmic solution Place 1 drop into both eyes every 4 (four) hours as needed for dry eyes.    Marland Kitchen quinapril (ACCUPRIL) 20 MG tablet Take 1 tablet (20 mg total) by mouth at bedtime. 90 tablet 3  . rosuvastatin (CRESTOR) 10 MG tablet Take 1 tablet by mouth once daily (Patient taking differently: 2 (two) times a week. ) 30 tablet 1   No current facility-administered medications for this visit.    Allergies:   Multaq [dronedarone]   Social History:  The patient  reports that he quit smoking about 45 years ago. His smoking use included cigarettes. He has a 20.00 pack-year smoking history. He has never used smokeless tobacco. He reports current alcohol use of about 1.0 standard drink of alcohol per week. He reports previous drug use.   Family History:  The patient's family history includes CAD in his mother; Heart attack in his father; Hyperlipidemia in his mother.   ROS:  Please see the history of present illness.   Otherwise, review of systems is positive for none.   All other systems are reviewed and negative.   PHYSICAL EXAM: VS:  BP 124/80   Pulse 83   Ht 5'  10" (1.778 m)   Wt 190 lb 3.2 oz (86.3 kg)   SpO2 97%   BMI 27.29 kg/m  , BMI Body mass index is 27.29 kg/m. GEN: Well nourished, well developed, in no acute distress  HEENT: normal  Neck: no JVD, carotid bruits, or masses Cardiac: RRR; no murmurs, rubs, or gallops,no edema  Respiratory:  clear to auscultation bilaterally, normal work of breathing GI: soft, nontender, nondistended, + BS MS: no deformity or atrophy  Skin: warm and dry Neuro:  Strength and sensation are intact Psych: euthymic  mood, full affect  EKG:  EKG is ordered today. Personal review of the ekg ordered shows sinus rhythm, rate 83  Recent Labs: 01/27/2020: ALT 19; BUN 11; Creatinine, Ser 1.10; Potassium 4.5; Sodium 140    Lipid Panel     Component Value Date/Time   CHOL 180 01/27/2020 1354   TRIG 150 (H) 01/27/2020 1354   HDL 50 01/27/2020 1354   CHOLHDL 3.6 01/27/2020 1354   LDLCALC 104 (H) 01/27/2020 1354     Wt Readings from Last 3 Encounters:  04/04/20 190 lb 3.2 oz (86.3 kg)  01/27/20 188 lb (85.3 kg)  09/21/19 194 lb 1.3 oz (88 kg)      Other studies Reviewed: Additional studies/ records that were reviewed today include: TTE 05/12/18  Review of the above records today demonstrates:  - Normal LVEF.   Normal LA size.   Trace TR, MR.   ASSESSMENT AND PLAN:  1. Paroxysmal atrial fibrillation/atrial flutter: Currently on Eliquis. CHA2DS2-VASc of 2.  Status post ablation 04/03/2019.  He fortunately remains in sinus rhythm.  No changes.   2. Hypertension: Currently well controlled   Current medicines are reviewed at length with the patient today.   The patient does not have concerns regarding his medicines.  The following changes were made today:   Labs/ tests ordered today include:  Orders Placed This Encounter  Procedures  . EKG 12-Lead     Disposition:   FU with Neenah Canter 6 months  Signed, Gregory Restivo Meredith Leeds, MD  04/04/2020 2:41 PM     [place] [address] [place] [address] [phone] (office) [phone] (fax)

## 2020-04-11 ENCOUNTER — Telehealth: Payer: Self-pay | Admitting: Cardiology

## 2020-04-11 NOTE — Telephone Encounter (Signed)
Patient is returning Luke's call. Please call back.

## 2020-04-11 NOTE — Telephone Encounter (Signed)
   Primary Cardiologist: Dr Bettina Gavia  Chart reviewed and patient contacted by phone today as part of pre-operative protocol coverage. Given past medical history and time since last visit, based on ACC/AHA guidelines, Gregory Vasquez would be at acceptable risk for the planned procedure without further cardiovascular testing.   OK to hold Eliquis 3 days pre op, resume as soon as safe post op.   I will route this recommendation to the requesting party via Epic fax function and remove from pre-op pool.  Please call with questions.  Kerin Ransom, PA-C 04/11/2020, 1:27 PM

## 2020-04-11 NOTE — Telephone Encounter (Signed)
Please comment on anticoagulation.  Kerin Ransom PA-C 04/11/2020 11:08 AM

## 2020-04-11 NOTE — Telephone Encounter (Signed)
   [facility] Pre-operative Risk Assessment    HEARTCARE STAFF: - Please ensure there is not already an duplicate clearance open for this procedure. - Under Visit Info/Reason for Call, type in Other and utilize the format Clearance MM/DD/YY or Clearance TBD. Do not use dashes or single digits. - If request is for dental extraction, please clarify the # of teeth to be extracted.  Request for surgical clearance:  1. What type of surgery is being performed? Right  ortho plaspy  2. When is this surgery scheduled? 05/06/20  3. What type of clearance is required (medical clearance vs. Pharmacy clearance to hold med vs. Both)? Both   4. Are there any medications that need to be held prior to surgery and how long?Elquis   5. Practice name and name of physician performing surgery? Dr. Frederik Pear  6. What is the office phone number? [phone]   7.   What is the office fax number?[phone]  8.   Anesthesia type (None, local, MAC, general) ? Spinal    Gregory Vasquez 04/11/2020, 9:58 AM  _________________________________________________________________   (provider comments below)

## 2020-04-11 NOTE — Telephone Encounter (Signed)
Left message to call back  Kerin Ransom PA-C 04/11/2020 12:28 PM

## 2020-04-11 NOTE — Telephone Encounter (Signed)
Patient with diagnosis of afib on Eliquis for anticoagulation.    Procedure: right hip arthroplasty Date of procedure: 05/06/20  CHADS2-VASc score of 4 (age x2, HTN, CAD)  CrCl 51mL/min Platelet count 222K  Per office protocol, patient can hold Eliquis for 3 days prior to procedure.

## 2020-04-14 DIAGNOSIS — R7303 Prediabetes: Secondary | ICD-10-CM | POA: Diagnosis not present

## 2020-04-14 DIAGNOSIS — Z01818 Encounter for other preprocedural examination: Secondary | ICD-10-CM | POA: Diagnosis not present

## 2020-04-26 DIAGNOSIS — M1611 Unilateral primary osteoarthritis, right hip: Secondary | ICD-10-CM | POA: Diagnosis not present

## 2020-04-29 DIAGNOSIS — E782 Mixed hyperlipidemia: Secondary | ICD-10-CM | POA: Diagnosis not present

## 2020-04-29 DIAGNOSIS — E1169 Type 2 diabetes mellitus with other specified complication: Secondary | ICD-10-CM | POA: Diagnosis not present

## 2020-04-29 DIAGNOSIS — M6530 Trigger finger, unspecified finger: Secondary | ICD-10-CM | POA: Diagnosis not present

## 2020-04-29 DIAGNOSIS — Z125 Encounter for screening for malignant neoplasm of prostate: Secondary | ICD-10-CM | POA: Diagnosis not present

## 2020-05-06 DIAGNOSIS — M1611 Unilateral primary osteoarthritis, right hip: Secondary | ICD-10-CM | POA: Diagnosis not present

## 2020-05-09 DIAGNOSIS — M25651 Stiffness of right hip, not elsewhere classified: Secondary | ICD-10-CM | POA: Diagnosis not present

## 2020-05-09 DIAGNOSIS — Z96641 Presence of right artificial hip joint: Secondary | ICD-10-CM | POA: Diagnosis not present

## 2020-05-09 DIAGNOSIS — M25551 Pain in right hip: Secondary | ICD-10-CM | POA: Diagnosis not present

## 2020-05-09 DIAGNOSIS — R2689 Other abnormalities of gait and mobility: Secondary | ICD-10-CM | POA: Diagnosis not present

## 2020-05-17 DIAGNOSIS — Z96641 Presence of right artificial hip joint: Secondary | ICD-10-CM | POA: Diagnosis not present

## 2020-05-17 DIAGNOSIS — Z471 Aftercare following joint replacement surgery: Secondary | ICD-10-CM | POA: Diagnosis not present

## 2020-05-18 DIAGNOSIS — M25651 Stiffness of right hip, not elsewhere classified: Secondary | ICD-10-CM | POA: Diagnosis not present

## 2020-05-18 DIAGNOSIS — Z96641 Presence of right artificial hip joint: Secondary | ICD-10-CM | POA: Diagnosis not present

## 2020-05-18 DIAGNOSIS — R2689 Other abnormalities of gait and mobility: Secondary | ICD-10-CM | POA: Diagnosis not present

## 2020-05-18 DIAGNOSIS — M25551 Pain in right hip: Secondary | ICD-10-CM | POA: Diagnosis not present

## 2020-05-24 DIAGNOSIS — M25551 Pain in right hip: Secondary | ICD-10-CM | POA: Diagnosis not present

## 2020-05-24 DIAGNOSIS — M25651 Stiffness of right hip, not elsewhere classified: Secondary | ICD-10-CM | POA: Diagnosis not present

## 2020-05-24 DIAGNOSIS — Z96641 Presence of right artificial hip joint: Secondary | ICD-10-CM | POA: Diagnosis not present

## 2020-05-24 DIAGNOSIS — R2689 Other abnormalities of gait and mobility: Secondary | ICD-10-CM | POA: Diagnosis not present

## 2020-06-01 DIAGNOSIS — M25651 Stiffness of right hip, not elsewhere classified: Secondary | ICD-10-CM | POA: Diagnosis not present

## 2020-06-01 DIAGNOSIS — M25551 Pain in right hip: Secondary | ICD-10-CM | POA: Diagnosis not present

## 2020-06-01 DIAGNOSIS — R2689 Other abnormalities of gait and mobility: Secondary | ICD-10-CM | POA: Diagnosis not present

## 2020-06-01 DIAGNOSIS — Z96641 Presence of right artificial hip joint: Secondary | ICD-10-CM | POA: Diagnosis not present

## 2020-06-07 DIAGNOSIS — Z96641 Presence of right artificial hip joint: Secondary | ICD-10-CM | POA: Diagnosis not present

## 2020-06-07 DIAGNOSIS — M25551 Pain in right hip: Secondary | ICD-10-CM | POA: Diagnosis not present

## 2020-06-07 DIAGNOSIS — M25651 Stiffness of right hip, not elsewhere classified: Secondary | ICD-10-CM | POA: Diagnosis not present

## 2020-06-07 DIAGNOSIS — R2689 Other abnormalities of gait and mobility: Secondary | ICD-10-CM | POA: Diagnosis not present

## 2020-06-14 DIAGNOSIS — R2689 Other abnormalities of gait and mobility: Secondary | ICD-10-CM | POA: Diagnosis not present

## 2020-06-14 DIAGNOSIS — M25551 Pain in right hip: Secondary | ICD-10-CM | POA: Diagnosis not present

## 2020-06-14 DIAGNOSIS — M25651 Stiffness of right hip, not elsewhere classified: Secondary | ICD-10-CM | POA: Diagnosis not present

## 2020-06-14 DIAGNOSIS — Z96641 Presence of right artificial hip joint: Secondary | ICD-10-CM | POA: Diagnosis not present

## 2020-06-21 DIAGNOSIS — M25651 Stiffness of right hip, not elsewhere classified: Secondary | ICD-10-CM | POA: Diagnosis not present

## 2020-06-21 DIAGNOSIS — Z96641 Presence of right artificial hip joint: Secondary | ICD-10-CM | POA: Diagnosis not present

## 2020-06-21 DIAGNOSIS — R2689 Other abnormalities of gait and mobility: Secondary | ICD-10-CM | POA: Diagnosis not present

## 2020-06-21 DIAGNOSIS — M25551 Pain in right hip: Secondary | ICD-10-CM | POA: Diagnosis not present

## 2020-06-28 DIAGNOSIS — R2689 Other abnormalities of gait and mobility: Secondary | ICD-10-CM | POA: Diagnosis not present

## 2020-06-28 DIAGNOSIS — M25651 Stiffness of right hip, not elsewhere classified: Secondary | ICD-10-CM | POA: Diagnosis not present

## 2020-06-28 DIAGNOSIS — M25551 Pain in right hip: Secondary | ICD-10-CM | POA: Diagnosis not present

## 2020-06-28 DIAGNOSIS — Z96641 Presence of right artificial hip joint: Secondary | ICD-10-CM | POA: Diagnosis not present

## 2020-07-05 DIAGNOSIS — M25651 Stiffness of right hip, not elsewhere classified: Secondary | ICD-10-CM | POA: Diagnosis not present

## 2020-07-05 DIAGNOSIS — R2689 Other abnormalities of gait and mobility: Secondary | ICD-10-CM | POA: Diagnosis not present

## 2020-07-05 DIAGNOSIS — M25551 Pain in right hip: Secondary | ICD-10-CM | POA: Diagnosis not present

## 2020-07-05 DIAGNOSIS — Z96641 Presence of right artificial hip joint: Secondary | ICD-10-CM | POA: Diagnosis not present

## 2020-07-12 DIAGNOSIS — M25651 Stiffness of right hip, not elsewhere classified: Secondary | ICD-10-CM | POA: Diagnosis not present

## 2020-07-12 DIAGNOSIS — M25551 Pain in right hip: Secondary | ICD-10-CM | POA: Diagnosis not present

## 2020-07-12 DIAGNOSIS — R2689 Other abnormalities of gait and mobility: Secondary | ICD-10-CM | POA: Diagnosis not present

## 2020-07-12 DIAGNOSIS — Z96641 Presence of right artificial hip joint: Secondary | ICD-10-CM | POA: Diagnosis not present

## 2020-07-19 DIAGNOSIS — M25551 Pain in right hip: Secondary | ICD-10-CM | POA: Diagnosis not present

## 2020-07-19 DIAGNOSIS — M25651 Stiffness of right hip, not elsewhere classified: Secondary | ICD-10-CM | POA: Diagnosis not present

## 2020-07-19 DIAGNOSIS — Z96641 Presence of right artificial hip joint: Secondary | ICD-10-CM | POA: Diagnosis not present

## 2020-07-19 DIAGNOSIS — R2689 Other abnormalities of gait and mobility: Secondary | ICD-10-CM | POA: Diagnosis not present

## 2020-07-29 DIAGNOSIS — Z96641 Presence of right artificial hip joint: Secondary | ICD-10-CM | POA: Diagnosis not present

## 2020-07-29 DIAGNOSIS — M25551 Pain in right hip: Secondary | ICD-10-CM | POA: Diagnosis not present

## 2020-07-29 DIAGNOSIS — R2689 Other abnormalities of gait and mobility: Secondary | ICD-10-CM | POA: Diagnosis not present

## 2020-07-29 DIAGNOSIS — M25651 Stiffness of right hip, not elsewhere classified: Secondary | ICD-10-CM | POA: Diagnosis not present

## 2020-07-30 ENCOUNTER — Other Ambulatory Visit: Payer: Self-pay | Admitting: Cardiology

## 2020-08-03 DIAGNOSIS — E782 Mixed hyperlipidemia: Secondary | ICD-10-CM | POA: Diagnosis not present

## 2020-08-03 DIAGNOSIS — E1169 Type 2 diabetes mellitus with other specified complication: Secondary | ICD-10-CM | POA: Diagnosis not present

## 2020-08-03 DIAGNOSIS — I10 Essential (primary) hypertension: Secondary | ICD-10-CM | POA: Diagnosis not present

## 2020-08-03 DIAGNOSIS — Z0000 Encounter for general adult medical examination without abnormal findings: Secondary | ICD-10-CM | POA: Diagnosis not present

## 2020-08-04 ENCOUNTER — Ambulatory Visit: Payer: Medicare Other | Admitting: Cardiology

## 2020-08-04 NOTE — Progress Notes (Signed)
Cardiology Office Note:    Date:  08/05/2020   ID:  Gregory Vasquez, DOB [birth year], MRN [id number]  PCP:  Lawerance Cruel, MD  Cardiologist:  Shirlee More, MD    Referring MD: Lawerance Cruel, MD    ASSESSMENT:    1. Paroxysmal atrial fibrillation (HCC)   2. Chronic anticoagulation   3. Essential hypertension   4. Mixed hyperlipidemia    PLAN:    In order of problems listed above:  1. Stable maintaining sinus rhythm more than a year out from his EP catheter ablation and plan will be to transition from Eliquis to aspirin with home monitoring of his rhythm.  If he documents atrial fibrillation or possible atrial fibrillation he will contact us through my chart and transmit strips. 2. He will not resume Eliquis will start aspirin 81 mg daily 3. Stable BP is at target continue current regimen with beta-blocker ACE inhibitor and I strongly encouraged him to continue to monitor heart rate and blood pressure at home 4. Stable continue his high intensity statin   Next appointment: 1 year   Medication Adjustments/Labs and Tests Ordered: Current medicines are reviewed at length with the patient today.  Concerns regarding medicines are outlined above.  Orders Placed This Encounter  Procedures   EKG 12-Lead   Meds ordered this encounter  Medications   aspirin EC 81 MG tablet    Sig: Take 1 tablet (81 mg total) by mouth daily. Swallow whole.    Dispense:  90 tablet    Refill:  3    No chief complaint on file.   History of Present Illness:    Gregory Vasquez is a 74 y.o. male with a hx of paroxysmal atrial fibrillation and typical atrial flutter CAD hypertension and hyperlipidemia.  He has had EP catheter ablation withDr. Curt Bears.  He was intolerant of Multaq and with flecainide developed rapid conduction of atrial flutter.  He had atrial fibrillation ablation 04/03/2019.  He was last seen 01/27/2020.  Compliance with diet, lifestyle and medications: Yes  Recent labs  primary care physician 08/03/2020: Cholesterol 196 LDL 119 triglycerides 165 HDL 51 A1c at target 6.7% Creatinine 1.09  Other studies Reviewed: Additional studies/ records that were reviewed today include: TTE 05/12/18  Review of the above records today demonstrates:  - Normal LVEF.   Normal LA size.   Trace TR, MR.  He has been out of Eliquis now for about a week and would prefer not to restart. He has good healthcare literacy he screens his heart rhythm with the iPhone adapter,Kardia and is signed up for my chart. I told him I think it is a reasonable choice to stop Eliquis restart aspirin as long as he screens himself symptomatically and otherwise 3 times a week any findings of atrial fibrillation to use my chart to transmit the strip.  He understands if we find atrial fibrillation and resume anticoagulation. Had no symptomatic episodes of palpitation he checks blood pressure at home he has no irregular marker or rapid heart rates blood pressures in range and no edema shortness of breath chest pain or syncope. Past Medical History:  Diagnosis Date   Hypertension     Past Surgical History:  Procedure Laterality Date   ATRIAL FIBRILLATION ABLATION N/A 04/03/2019   Procedure: ATRIAL FIBRILLATION ABLATION;  Surgeon: Constance Haw, MD;  Location: [place] [facility];  Service: Cardiovascular;  Laterality: N/A;   CHOLECYSTECTOMY     TONSILLECTOMY      Current Medications: Current  Meds  Medication Sig   Multiple Vitamin (MULTIVITAMIN WITH MINERALS) TABS tablet Take 1 tablet by mouth daily. Adult Multivitamin Centrum 50+   Omega-3 Fatty Acids (FISH OIL ULTRA) 1400 MG CAPS Take 1,400 mg by mouth daily.   omeprazole (PRILOSEC OTC) 20 MG tablet Take 10 mg by mouth daily before breakfast.   polyvinyl alcohol (LIQUIFILM TEARS) 1.4 % ophthalmic solution Place 1 drop into both eyes every 4 (four) hours as needed for dry eyes.   quinapril (ACCUPRIL) 20 MG tablet Take 1 tablet (20  mg total) by mouth at bedtime.   rosuvastatin (CRESTOR) 10 MG tablet Take 1 tablet by mouth once daily (Patient taking differently: 2 (two) times a week.)     Allergies:   Multaq [dronedarone]   Social History   Socioeconomic History   Marital status: Married    Spouse name: Not on file   Number of children: Not on file   Years of education: Not on file   Highest education level: Not on file  Occupational History   Not on file  Tobacco Use   Smoking status: Former Smoker    Packs/day: 2.00    Years: 10.00    Pack years: 20.00    Types: Cigarettes    Quit date: 50    Years since quitting: 45.9   Smokeless tobacco: Never Used  Scientific laboratory technician Use: Never used  Substance and Sexual Activity   Alcohol use: Yes    Alcohol/week: 1.0 standard drink    Types: 1 Cans of beer per week    Comment: 1 beer every 2 -3 days   Drug use: Not Currently   Sexual activity: Not on file  Other Topics Concern   Not on file  Social History Narrative   Not on file   Social Determinants of Health   Financial Resource Strain: Not on file  Food Insecurity: Not on file  Transportation Needs: Not on file  Physical Activity: Not on file  Stress: Not on file  Social Connections: Not on file     Family History: The patient's family history includes CAD in his mother; Heart attack in his father; Hyperlipidemia in his mother. ROS:   Please see the history of present illness.    All other systems reviewed and are negative.  EKGs/Labs/Other Studies Reviewed:    The following studies were reviewed today:  EKG performed by the EP office 04/04/2020 independently reviewed sinus rhythm normal  EKG:  EKG ordered today and personally reviewed.  The ekg ordered today demonstrates sinus rhythm 1 APC otherwise normal  Recent Labs: 01/27/2020: ALT 19; BUN 11; Creatinine, Ser 1.10; Potassium 4.5; Sodium 140  Recent Lipid Panel    Component Value Date/Time   CHOL 180 01/27/2020  1354   TRIG 150 (H) 01/27/2020 1354   HDL 50 01/27/2020 1354   CHOLHDL 3.6 01/27/2020 1354   LDLCALC 104 (H) 01/27/2020 1354    Physical Exam:    VS:  BP 118/72    Pulse 78    Ht 5\' 10"  (1.778 m)    Wt 190 lb (86.2 kg)    SpO2 96%    BMI 27.26 kg/m     Wt Readings from Last 3 Encounters:  08/05/20 190 lb (86.2 kg)  04/04/20 190 lb 3.2 oz (86.3 kg)  01/27/20 188 lb (85.3 kg)     GEN:  Well nourished, well developed in no acute distress HEENT: Normal NECK: No JVD; No carotid bruits LYMPHATICS: No lymphadenopathy  CARDIAC: RRR, no murmurs, rubs, gallops RESPIRATORY:  Clear to auscultation without rales, wheezing or rhonchi  ABDOMEN: Soft, non-tender, non-distended MUSCULOSKELETAL:  No edema; No deformity  SKIN: Warm and dry NEUROLOGIC:  Alert and oriented x 3 PSYCHIATRIC:  Normal affect    Signed, Shirlee More, MD  08/05/2020 9:16 AM    [place]

## 2020-08-05 ENCOUNTER — Ambulatory Visit: Payer: Medicare Other | Admitting: Cardiology

## 2020-08-05 ENCOUNTER — Encounter: Payer: Self-pay | Admitting: Cardiology

## 2020-08-05 ENCOUNTER — Other Ambulatory Visit: Payer: Self-pay

## 2020-08-05 VITALS — BP 118/72 | HR 78 | Ht 70.0 in | Wt 190.0 lb

## 2020-08-05 DIAGNOSIS — E782 Mixed hyperlipidemia: Secondary | ICD-10-CM | POA: Diagnosis not present

## 2020-08-05 DIAGNOSIS — I480 Paroxysmal atrial fibrillation: Secondary | ICD-10-CM

## 2020-08-05 DIAGNOSIS — Z7901 Long term (current) use of anticoagulants: Secondary | ICD-10-CM

## 2020-08-05 DIAGNOSIS — I10 Essential (primary) hypertension: Secondary | ICD-10-CM | POA: Diagnosis not present

## 2020-08-05 MED ORDER — ASPIRIN EC 81 MG PO TBEC
81.0000 mg | DELAYED_RELEASE_TABLET | Freq: Every day | ORAL | 3 refills | Status: AC
Start: 2020-08-05 — End: ?

## 2020-08-05 NOTE — Patient Instructions (Signed)
Medication Instructions:  Your physician has recommended you make the following change in your medication:  START: Aspirin 81 mg take one tablet by mouth daily.  *If you need a refill on your cardiac medications before your next appointment, please call your pharmacy*   Lab Work: None If you have labs (blood work) drawn today and your tests are completely normal, you will receive your results only by: MyChart Message (if you have MyChart) OR A paper copy in the mail If you have any lab test that is abnormal or we need to change your treatment, we will call you to review the results.   Testing/Procedures: None   Follow-Up: At [facility], you and your health needs are our priority.  As part of our continuing mission to provide you with exceptional heart care, we have created designated Provider Care Teams.  These Care Teams include your primary Cardiologist (physician) and Advanced Practice Providers (APPs -  Physician Assistants and Nurse Practitioners) who all work together to provide you with the care you need, when you need it.  We recommend signing up for the patient portal called "MyChart".  Sign up information is provided on this After Visit Summary.  MyChart is used to connect with patients for Virtual Visits (Telemedicine).  Patients are able to view lab/test results, encounter notes, upcoming appointments, etc.  Non-urgent messages can be sent to your provider as well.   To learn more about what you can do with MyChart, go to [email or web address].    Your next appointment:   1 year(s)  The format for your next appointment:   In Person  Provider:   Brian Munley, MD    Other Instructions   

## 2020-09-11 ENCOUNTER — Other Ambulatory Visit: Payer: Self-pay | Admitting: Cardiology

## 2020-09-12 NOTE — Telephone Encounter (Signed)
Refill sent to pharmacy.   

## 2020-09-22 DIAGNOSIS — H43811 Vitreous degeneration, right eye: Secondary | ICD-10-CM | POA: Diagnosis not present

## 2020-09-22 DIAGNOSIS — H25813 Combined forms of age-related cataract, bilateral: Secondary | ICD-10-CM | POA: Diagnosis not present

## 2020-09-22 DIAGNOSIS — H40013 Open angle with borderline findings, low risk, bilateral: Secondary | ICD-10-CM | POA: Diagnosis not present

## 2020-09-22 DIAGNOSIS — H35372 Puckering of macula, left eye: Secondary | ICD-10-CM | POA: Diagnosis not present

## 2020-10-18 ENCOUNTER — Telehealth: Payer: Self-pay | Admitting: Cardiology

## 2020-10-18 NOTE — Telephone Encounter (Signed)
Left message for Gregory Vasquez to return call.

## 2020-10-18 NOTE — Telephone Encounter (Signed)
The only way I know what he is doing with his medication frequency is when we reconcile the medical record  I would change the medical record to document he is taking it 2 days a week and continue the same.

## 2020-10-18 NOTE — Telephone Encounter (Signed)
Pt c/o medication issue:  1. Name of Medication: rosuvastatin (CRESTOR) 10 MG tablet  2. How are you currently taking this medication (dosage and times per day)? Patient takes 1 tablet Monday's and Friday's  3. Are you having a reaction (difficulty breathing--STAT)? no  4. What is your medication issue? Vicente Males from [employer] calling to clarify how the patient is supposed to take the medication. She states he has only been taking it Mondays and Fridays, but on the prescription it states to take daily.

## 2020-10-20 MED ORDER — ROSUVASTATIN CALCIUM 10 MG PO TABS: ORAL_TABLET | ORAL | 0 refills | Status: DC

## 2020-10-20 NOTE — Telephone Encounter (Signed)
Spoke with Gregory Vasquez informed her we are updating RX to state he is taking crestor 2 times weekly. She understood no further questions.

## 2021-01-02 ENCOUNTER — Ambulatory Visit: Payer: Medicare Other | Admitting: Cardiology

## 2021-01-02 ENCOUNTER — Encounter: Payer: Self-pay | Admitting: Cardiology

## 2021-01-02 ENCOUNTER — Other Ambulatory Visit: Payer: Self-pay

## 2021-01-02 VITALS — BP 120/92 | HR 96 | Ht 69.0 in | Wt 191.0 lb

## 2021-01-02 DIAGNOSIS — Z79899 Other long term (current) drug therapy: Secondary | ICD-10-CM | POA: Diagnosis not present

## 2021-01-02 DIAGNOSIS — I480 Paroxysmal atrial fibrillation: Secondary | ICD-10-CM

## 2021-01-02 NOTE — Progress Notes (Signed)
Electrophysiology Office Note   Date:  01/02/2021   ID:  Brace, Welte [birth year], MRN [id number]  PCP:  Lawerance Cruel, MD  Cardiologist:  [facility] Primary Electrophysiologist:  Dr Curt Bears    CC: Evaluation for atrial fibrillation/atrial flutter   History of Present Illness: Gregory Vasquez is a 75 y.o. male who is being seen today for the evaluation of atrial fibrillation at the request of Lawerance Cruel, MD. Presenting today for electrophysiology evaluation.    He has a history of paroxysmal atrial fibrillation and typical atrial flutter.  He was initially on Multaq but is intolerant due to GI side effects.  He was started on flecainide but continued to have multiple episodes of atrial flutter and this was discontinued.  He is now status post A. fib ablation 04/03/2019.  Today, denies symptoms of palpitations, chest pain, shortness of breath, orthopnea, PND, lower extremity edema, claudication, dizziness, presyncope, syncope, bleeding, or neurologic sequela. The patient is tolerating medications without difficulties.  Since being seen he has done well.  He has had no chest pain or shortness of breath.  He is able do all of his daily activities and has had no restrictions.  He is noted no further episodes of atrial fibrillation.  His only issue is knee pain.  Currently seeing orthopedics.   Past Medical History:  Diagnosis Date  . Hypertension    Past Surgical History:  Procedure Laterality Date  . ATRIAL FIBRILLATION ABLATION N/A 04/03/2019   Procedure: ATRIAL FIBRILLATION ABLATION;  Surgeon: Constance Haw, MD;  Location: [place] [facility];  Service: Cardiovascular;  Laterality: N/A;  . CHOLECYSTECTOMY    . TONSILLECTOMY       Current Outpatient Medications  Medication Sig Dispense Refill  . aspirin EC 81 MG tablet Take 1 tablet (81 mg total) by mouth daily. Swallow whole. 90 tablet 3  . Multiple Vitamin (MULTIVITAMIN WITH MINERALS) TABS tablet Take 1 tablet  by mouth daily. Adult Multivitamin Centrum 50+    . Omega-3 Fatty Acids (FISH OIL ULTRA) 1400 MG CAPS Take 1,400 mg by mouth daily.    Marland Kitchen omeprazole (PRILOSEC OTC) 20 MG tablet Take 10 mg by mouth daily before breakfast.    . polyvinyl alcohol (LIQUIFILM TEARS) 1.4 % ophthalmic solution Place 1 drop into both eyes every 4 (four) hours as needed for dry eyes.    Marland Kitchen quinapril (ACCUPRIL) 20 MG tablet Take 1 tablet (20 mg total) by mouth at bedtime. 90 tablet 3  . rosuvastatin (CRESTOR) 10 MG tablet Take two times weekly. 32 tablet 0   No current facility-administered medications for this visit.    Allergies:   Multaq [dronedarone]   Social History:  The patient  reports that he quit smoking about 46 years ago. His smoking use included cigarettes. He has a 20.00 pack-year smoking history. He has never used smokeless tobacco. He reports current alcohol use of about 1.0 standard drink of alcohol per week. He reports previous drug use.   Family History:  The patient's family history includes CAD in his mother; Heart attack in his father; Hyperlipidemia in his mother.   ROS:  Please see the history of present illness.   Otherwise, review of systems is positive for none.   All other systems are reviewed and negative.   PHYSICAL EXAM: VS:  BP (!) 120/92   Pulse 96   Ht 5\' 9"  (1.753 m)   Wt 191 lb 0.6 oz (86.7 kg)   BMI 28.21 kg/m  ,  BMI Body mass index is 28.21 kg/m. GEN: Well nourished, well developed, in no acute distress  HEENT: normal  Neck: no JVD, carotid bruits, or masses Cardiac: RRR; no murmurs, rubs, or gallops,no edema  Respiratory:  clear to auscultation bilaterally, normal work of breathing GI: soft, nontender, nondistended, + BS MS: no deformity or atrophy  Skin: warm and dry Neuro:  Strength and sensation are intact Psych: euthymic mood, full affect  EKG:  EKG is ordered today. Personal review of the ekg ordered shows sinus rhythm, rate 96 from  Recent Labs: 01/27/2020: ALT  19; BUN 11; Creatinine, Ser 1.10; Potassium 4.5; Sodium 140    Lipid Panel     Component Value Date/Time   CHOL 180 01/27/2020 1354   TRIG 150 (H) 01/27/2020 1354   HDL 50 01/27/2020 1354   CHOLHDL 3.6 01/27/2020 1354   LDLCALC 104 (H) 01/27/2020 1354     Wt Readings from Last 3 Encounters:  01/02/21 191 lb 0.6 oz (86.7 kg)  08/05/20 190 lb (86.2 kg)  04/04/20 190 lb 3.2 oz (86.3 kg)      Other studies Reviewed: Additional studies/ records that were reviewed today include: TTE 05/12/18  Review of the above records today demonstrates:  - Normal LVEF.   Normal LA size.   Trace TR, MR.   ASSESSMENT AND PLAN:  1.  Paroxysmal atrial fibrillation/flutter: Currently on Eliquis.  CHA2DS2-VASc of 2.  Status post ablation 04/03/2019.  He is remained in sinus rhythm without further episodes.  No changes.   2.  Hypertension: Currently well controlled   Current medicines are reviewed at length with the patient today.   The patient does not have concerns regarding his medicines.  The following changes were made today: None  Labs/ tests ordered today include:  Orders Placed This Encounter  Procedures  . CBC  . Basic metabolic panel  . EKG 12-Lead     Disposition:   FU with Karisa Nesser 12 months  Signed, Tyon Cerasoli Meredith Leeds, MD  01/02/2021 3:58 PM     [facility] [address] [place] [address] [phone] (office) [phone] (fax)

## 2021-01-02 NOTE — Patient Instructions (Addendum)
Medication Instructions:  Your physician recommends that you continue on your current medications as directed. Please refer to the Current Medication list given to you today.  *If you need a refill on your cardiac medications before your next appointment, please call your pharmacy*   Lab Work: Today: CBC & BMET If you have labs (blood work) drawn today and your tests are completely normal, you will receive your results only by: Marland Kitchen MyChart Message (if you have MyChart) OR . A paper copy in the mail If you have any lab test that is abnormal or we need to change your treatment, we will call you to review the results.   Testing/Procedures: None ordered   Follow-Up: At [facility], you and your health needs are our priority.  As part of our continuing mission to provide you with exceptional heart care, we have created designated Provider Care Teams.  These Care Teams include your primary Cardiologist (physician) and Advanced Practice Providers (APPs -  Physician Assistants and Nurse Practitioners) who all work together to provide you with the care you need, when you need it.   Your next appointment:   1 year(s)  The format for your next appointment:   In Person  Provider:   Allegra Lai, MD    Thank you for choosing [facility] [place]!!   Trinidad Curet, RN [phone]

## 2021-01-03 LAB — BASIC METABOLIC PANEL
BUN/Creatinine Ratio: 16 (ref 10–24)
BUN: 16 mg/dL (ref 8–27)
CO2: 23 mmol/L (ref 20–29)
Calcium: 9.1 mg/dL (ref 8.6–10.2)
Chloride: 101 mmol/L (ref 96–106)
Creatinine, Ser: 0.98 mg/dL (ref 0.76–1.27)
Glucose: 101 mg/dL — ABNORMAL HIGH (ref 65–99)
Potassium: 4.6 mmol/L (ref 3.5–5.2)
Sodium: 138 mmol/L (ref 134–144)
eGFR: 80 mL/min/{1.73_m2} (ref >59)

## 2021-01-03 LAB — CBC
Hematocrit: 46.1 % (ref 37.5–51.0)
Hemoglobin: 15.3 g/dL (ref 13.0–17.7)
MCH: 30.2 pg (ref 26.6–33.0)
MCHC: 33.2 g/dL (ref 31.5–35.7)
MCV: 91 fL (ref 79–97)
Platelets: 205 10*3/uL (ref 150–450)
RBC: 5.07 x10E6/uL (ref 4.14–5.80)
RDW: 12.9 % (ref 11.6–15.4)
WBC: 6.8 10*3/uL (ref 3.4–10.8)

## 2021-01-29 ENCOUNTER — Other Ambulatory Visit: Payer: Self-pay | Admitting: Cardiology

## 2021-01-30 NOTE — Telephone Encounter (Signed)
Quinapril 20 mg # 90 x 3 refills sent to pharmacy per request

## 2021-02-07 DIAGNOSIS — E1169 Type 2 diabetes mellitus with other specified complication: Secondary | ICD-10-CM | POA: Diagnosis not present

## 2021-02-07 DIAGNOSIS — B354 Tinea corporis: Secondary | ICD-10-CM | POA: Diagnosis not present

## 2021-02-07 DIAGNOSIS — M25559 Pain in unspecified hip: Secondary | ICD-10-CM | POA: Diagnosis not present

## 2021-02-23 ENCOUNTER — Other Ambulatory Visit: Payer: Self-pay | Admitting: Cardiology

## 2021-03-23 DIAGNOSIS — H43813 Vitreous degeneration, bilateral: Secondary | ICD-10-CM | POA: Diagnosis not present

## 2021-03-23 DIAGNOSIS — H35372 Puckering of macula, left eye: Secondary | ICD-10-CM | POA: Diagnosis not present

## 2021-03-23 DIAGNOSIS — H2513 Age-related nuclear cataract, bilateral: Secondary | ICD-10-CM | POA: Diagnosis not present

## 2021-03-23 DIAGNOSIS — H25013 Cortical age-related cataract, bilateral: Secondary | ICD-10-CM | POA: Diagnosis not present

## 2021-05-06 IMAGING — CT CT HEART MORPH/PULM VEIN WITH CONTRAST AND WITHOUT CORONARY CALC
2 of 9 series · 6 of 20 positions shown, 7 images · non-contrast
Comparison: 12/31/2003 chest radiograph, report only.
COMPARISON: 12/31/2003 chest radiograph, report only.

Addendum:
EXAM:
OVER-READ INTERPRETATION  CT CHEST

The following report is an over-read performed by radiologist Dr.
Tena Quiterio [REDACTED] on 03/30/2019. This over-read
does not include interpretation of cardiac or coronary anatomy or
pathology. The coronary CTA interpretation by the cardiologist is
attached.
CLINICAL DATA: Atrial fibrillation
Cardiac CTA
MEDICATIONS:
Sub lingual nitro. 4mg x 2
TECHNIQUE: The patient was scanned on a Siemens [REDACTED]ice scanner. Gantry
rotation speed was 250 msecs. Collimation was 0.6 mm. A 100 kV
prospective scan was triggered in the ascending thoracic aorta at
35-75% of the R-R interval. Average HR during the scan was 60 bpm.
The 3D data set was interpreted on a dedicated work station using
MPR, MIP and VRT modes. A total of 80cc of contrast was used.

[Series 10: best diast · axial · 0.34mm/px · z∈[+1157,+1212]mm · 3 of 275 slices shown, 4 images]
[im 69/275  vessel]
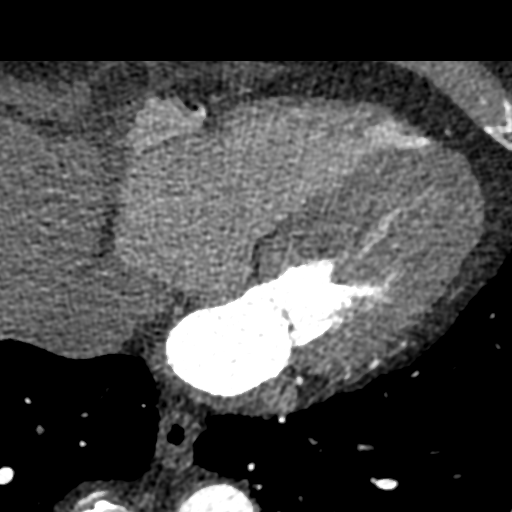
[im 69/275  lung]
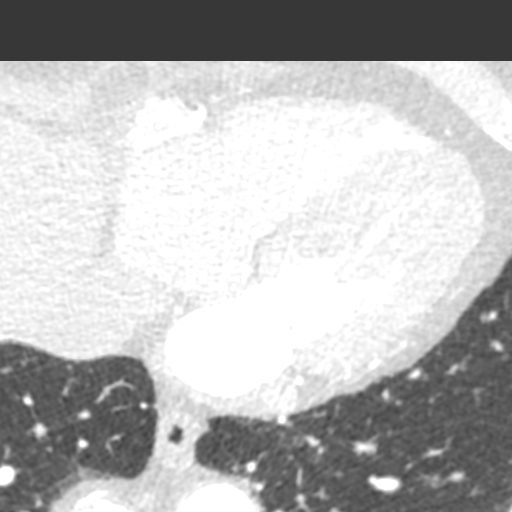
[im 138/275  vessel]
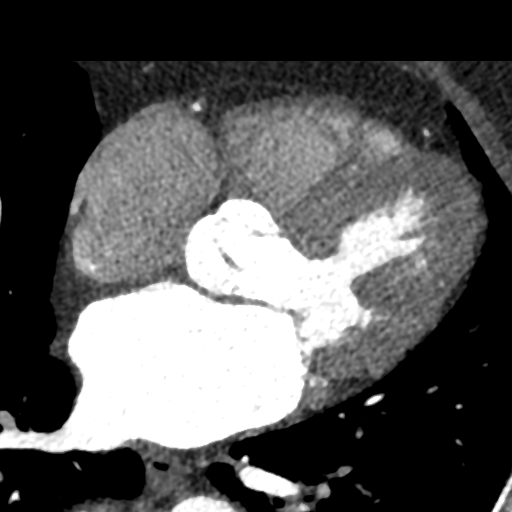
[im 206/275  vessel]
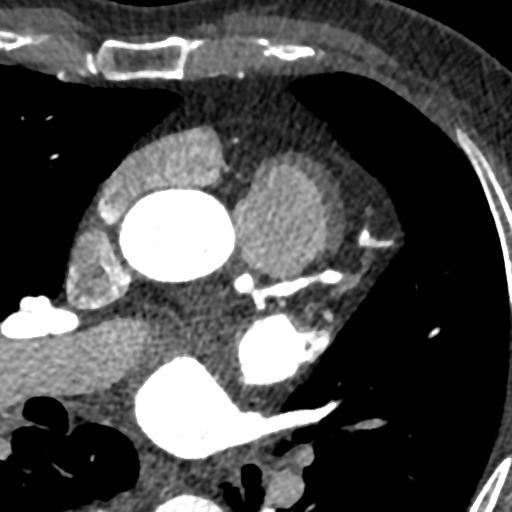

[Series 11: +300 ms · axial · 0.34mm/px · z∈[+1157,+1212]mm · 3 of 275 slices shown]
[im 69/275  vessel]
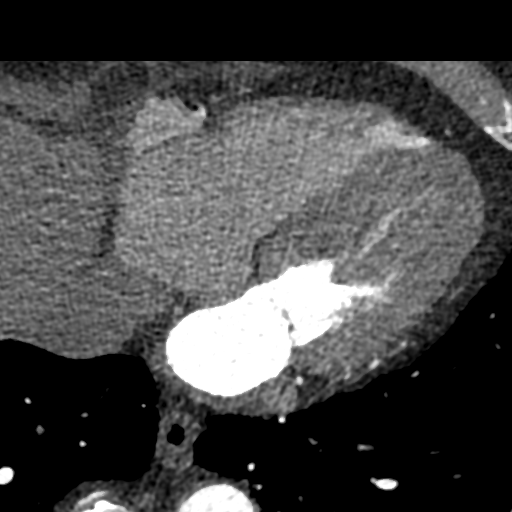
[im 138/275  vessel]
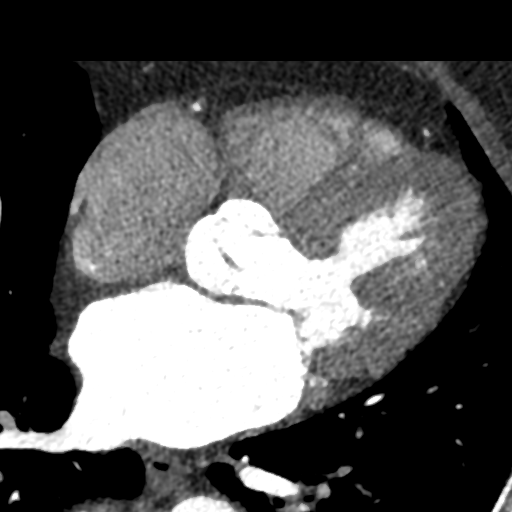
[im 206/275  vessel]
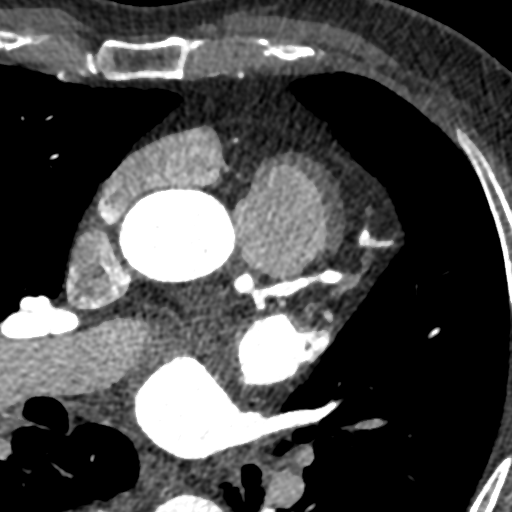

[6 of 20 positions shown; findings below may reference images not displayed]

FINDINGS: Vascular: Aortic atherosclerosis. No central pulmonary embolism, on
this non-dedicated study.

Mediastinum/Nodes: No imaged thoracic adenopathy.

Lungs/Pleura: No imaged pleural fluid. Clear imaged lungs.

Upper Abdomen: Too small to characterize high left hepatic lobe
lesion is likely a cyst at 6 mm.

Musculoskeletal: Midthoracic spondylosis.
IMPRESSION: No acute findings in the imaged extracardiac chest.

Aortic Atherosclerosis (PT7VC-CSG.G).
FINDINGS: Non-cardiac: See separate report from [REDACTED].

Pulmonary veins drain normally to the left atrium.

RUPV 19 x 16 mm

RMPV 7 x 7 mm

RLPV 17 x 14 mm

LUPV 20 x 12 mm

LLPV 15 x 14.5 mm

No LA appendage thrombus noted.

Calcium Score: 307 Agatston units.

Coronary Arteries: Right dominant with no anomalies

LM: No plaque or stenosis.

LAD system: Calcified plaque in the proximal LAD, minimal stenosis.
Calcified plaque in the mid LAD, up to moderate (51-69%) stenosis.

Circumflex system: No plaque or stenosis.

RCA system: No plaque or stenosis.
IMPRESSION: 1.  Pulmonary veins as described above.

2.  No evidence for LA appendage thrombus.

3. Coronary artery calcium score 307 Agatston units, this places the
patient in the 58th percentile for age and gender, suggesting
intermediate risk for future cardiac events.

4. Possible moderate mid LAD stenosis, will send for FFR to confirm.

Ajoouis Gunawan

*** End of Addendum ***
EXAM:
OVER-READ INTERPRETATION  CT CHEST

The following report is an over-read performed by radiologist Dr.
Tena Quiterio [REDACTED] on 03/30/2019. This over-read
does not include interpretation of cardiac or coronary anatomy or
pathology. The coronary CTA interpretation by the cardiologist is
attached.
FINDINGS: Vascular: Aortic atherosclerosis. No central pulmonary embolism, on
this non-dedicated study.

Mediastinum/Nodes: No imaged thoracic adenopathy.

Lungs/Pleura: No imaged pleural fluid. Clear imaged lungs.

Upper Abdomen: Too small to characterize high left hepatic lobe
lesion is likely a cyst at 6 mm.

Musculoskeletal: Midthoracic spondylosis.
IMPRESSION: No acute findings in the imaged extracardiac chest.

Aortic Atherosclerosis (PT7VC-CSG.G).

## 2021-05-15 ENCOUNTER — Other Ambulatory Visit: Payer: Self-pay | Admitting: Cardiology

## 2021-05-16 ENCOUNTER — Other Ambulatory Visit: Payer: Self-pay | Admitting: Cardiology

## 2021-08-04 DIAGNOSIS — Z125 Encounter for screening for malignant neoplasm of prostate: Secondary | ICD-10-CM | POA: Diagnosis not present

## 2021-08-04 DIAGNOSIS — E1169 Type 2 diabetes mellitus with other specified complication: Secondary | ICD-10-CM | POA: Diagnosis not present

## 2021-08-04 DIAGNOSIS — D509 Iron deficiency anemia, unspecified: Secondary | ICD-10-CM | POA: Diagnosis not present

## 2021-08-04 DIAGNOSIS — I10 Essential (primary) hypertension: Secondary | ICD-10-CM | POA: Diagnosis not present

## 2021-08-04 DIAGNOSIS — E782 Mixed hyperlipidemia: Secondary | ICD-10-CM | POA: Diagnosis not present

## 2021-08-04 DIAGNOSIS — Z0000 Encounter for general adult medical examination without abnormal findings: Secondary | ICD-10-CM | POA: Diagnosis not present

## 2021-08-04 DIAGNOSIS — Z23 Encounter for immunization: Secondary | ICD-10-CM | POA: Diagnosis not present

## 2021-08-24 ENCOUNTER — Other Ambulatory Visit: Payer: Self-pay

## 2021-08-24 ENCOUNTER — Encounter: Payer: Self-pay | Admitting: Cardiology

## 2021-08-24 ENCOUNTER — Ambulatory Visit: Payer: Medicare Other | Admitting: Cardiology

## 2021-08-24 VITALS — BP 116/82 | HR 80 | Ht 69.0 in | Wt 190.1 lb

## 2021-08-24 DIAGNOSIS — I2510 Atherosclerotic heart disease of native coronary artery without angina pectoris: Secondary | ICD-10-CM

## 2021-08-24 DIAGNOSIS — I480 Paroxysmal atrial fibrillation: Secondary | ICD-10-CM

## 2021-08-24 DIAGNOSIS — E782 Mixed hyperlipidemia: Secondary | ICD-10-CM | POA: Diagnosis not present

## 2021-08-24 DIAGNOSIS — I10 Essential (primary) hypertension: Secondary | ICD-10-CM

## 2021-08-24 MED ORDER — LISINOPRIL 20 MG PO TABS: 20.0000 mg | ORAL_TABLET | Freq: Every day | ORAL | 3 refills | Status: AC

## 2021-08-24 NOTE — Patient Instructions (Addendum)
Medication Instructions:  Your physician has recommended you make the following change in your medication:  STOP: Quinapril START: Lisinopril 20 mg take one tablet by mouth daily.  *If you need a refill on your cardiac medications before your next appointment, please call your pharmacy*   Lab Work: None If you have labs (blood work) drawn today and your tests are completely normal, you will receive your results only by: [place] (if you have MyChart) OR A paper copy in the mail If you have any lab test that is abnormal or we need to change your treatment, we will call you to review the results.   Testing/Procedures: None   Follow-Up: At [facility], you and your health needs are our priority.  As part of our continuing mission to provide you with exceptional heart care, we have created designated Provider Care Teams.  These Care Teams include your primary Cardiologist (physician) and Advanced Practice Providers (APPs -  Physician Assistants and Nurse Practitioners) who all work together to provide you with the care you need, when you need it.  We recommend signing up for the patient portal called "MyChart".  Sign up information is provided on this After Visit Summary.  MyChart is used to connect with patients for Virtual Visits (Telemedicine).  Patients are able to view lab/test results, encounter notes, upcoming appointments, etc.  Non-urgent messages can be sent to your provider as well.   To learn more about what you can do with MyChart, go to [email or web address].    Your next appointment:   Follow up with Dr. Curt Bears  The format for your next appointment:   In Person  Provider:   Dr. Curt Bears   Other Instructions

## 2021-08-24 NOTE — Progress Notes (Signed)
Cardiology Office Note:    Date:  08/24/2021   ID:  Gregory Vasquez, DOB [birth year], MRN [id number]  PCP:  Lawerance Cruel, MD  Cardiologist:  Shirlee More, MD    Referring MD: Lawerance Cruel, MD    ASSESSMENT:    1. Paroxysmal atrial fibrillation (HCC)   2. Essential hypertension   3. CAD in native artery   4. Mixed hyperlipidemia    PLAN:    In order of problems listed above:  [name] continues to do well following pulmonary vein isolation EP catheter ablation no breakthrough episodes of atrial fibrillation he has good healthcare literacy and screens his heart rhythm at home on a regular basis with the iPhone adapter.  I asked him to continue the same if he has a strip of atrial fibrillation or possibly can send me through [facility].  He is no longer anticoagulated does not take an antiarrhythmic drug.  He is seeing both me as well as EP I think it is redundant and I will plan to see back in the office as needed Poorly controlled off quinapril resume ACE inhibitor with lisinopril 20 mg daily Stable asymptomatic I would continue primary treatment aspirin and statin and I will think he requires a repeat ischemia evaluation at this time New York Heart Association class I Continue his high intensity statin lipids are at target well-tolerated   Next appointment: He will follow-up with the EP and see me back in the office as needed   Medication Adjustments/Labs and Tests Ordered: Current medicines are reviewed at length with the patient today.  Concerns regarding medicines are outlined above.  No orders of the defined types were placed in this encounter.  Meds ordered this encounter  Medications   lisinopril (ZESTRIL) 20 MG tablet    Sig: Take 1 tablet (20 mg total) by mouth daily.    Dispense:  90 tablet    Refill:  3    Chief Complaint  Patient presents with   Follow-up   Atrial Fibrillation   Coronary Artery Disease    History of Present Illness:    Gregory Vasquez  is a 76 y.o. male with a hx of paroxysmal atrial fibrillation and typical atrial flutter hypertension hyperlipidemia CAD he has had EP catheter ablation and is maintained sinus rhythm and was transitioned off anticoagulant to aspirin last seen 08/05/2020.  Compliance with diet, lifestyle and medications: Yes  He has done well but recently he has been unable to obtain quinapril on backorder and blood pressures greater than 150/90 and we will transition to lisinopril and resume He screens his heart rhythms at home no recurrent atrial fibrillation He is pleased with the quality of his life no edema shortness of breath chest pain palpitation or syncope. He tolerates his high intensity statin without muscle pain or weakness. Most recent labs 08/04/2021 cholesterol 176 LDL 109 HDL 42 triglycerides 136 A1c 6.7%  Cardiac CTA prior to EP catheter ablation August 2020 showed a coronary artery calcium score elevated 307 58th percentile and a moderate stenosis in the mid left anterior descending coronary artery with recommendations for medical therapy. Past Medical History:  Diagnosis Date   Hypertension     Past Surgical History:  Procedure Laterality Date   ATRIAL FIBRILLATION ABLATION N/A 04/03/2019   Procedure: ATRIAL FIBRILLATION ABLATION;  Surgeon: Constance Haw, MD;  Location: [facility];  Service: Cardiovascular;  Laterality: N/A;   CHOLECYSTECTOMY     TONSILLECTOMY      Current Medications:  Current Meds  Medication Sig   aspirin EC 81 MG tablet Take 1 tablet (81 mg total) by mouth daily. Swallow whole.   Coenzyme Q10 (CO Q-10) 300 MG CAPS Take 300 mg by mouth daily.   lisinopril (ZESTRIL) 20 MG tablet Take 1 tablet (20 mg total) by mouth daily.   Multiple Vitamins-Minerals (PRESERVISION AREDS 2) CAPS Take 1 tablet by mouth 2 (two) times daily.   Omega-3 Fatty Acids (FISH OIL ULTRA) 1400 MG CAPS Take 1,400 mg by mouth daily.   omeprazole (PRILOSEC OTC) 20 MG tablet Take 10  mg by mouth daily before breakfast.   polyvinyl alcohol (LIQUIFILM TEARS) 1.4 % ophthalmic solution Place 1 drop into both eyes every 4 (four) hours as needed for dry eyes.   rosuvastatin (CRESTOR) 10 MG tablet TAKE 1 TABLET BY MOUTH TWICE A WEEK     Allergies:   Multaq [dronedarone]   Social History   Socioeconomic History   Marital status: Married    Spouse name: Not on file   Number of children: Not on file   Years of education: Not on file   Highest education level: Not on file  Occupational History   Not on file  Tobacco Use   Smoking status: Former    Packs/day: 2.00    Years: 10.00    Pack years: 20.00    Types: Cigarettes    Quit date: 46    Years since quitting: 47.0   Smokeless tobacco: Never  Vaping Use   Vaping Use: Never used  Substance and Sexual Activity   Alcohol use: Yes    Alcohol/week: 1.0 standard drink    Types: 1 Cans of beer per week    Comment: 1 beer every 2 -3 days   Drug use: Not Currently   Sexual activity: Not on file  Other Topics Concern   Not on file  Social History Narrative   Not on file   Social Determinants of Health   Financial Resource Strain: Not on file  Food Insecurity: Not on file  Transportation Needs: Not on file  Physical Activity: Not on file  Stress: Not on file  Social Connections: Not on file     Family History: The patient's family history includes CAD in his mother; Heart attack in his father; Hyperlipidemia in his mother. ROS:   Please see the history of present illness.    All other systems reviewed and are negative.  EKGs/Labs/Other Studies Reviewed:    The following studies were reviewed today:  EKG: Performed 01/02/2021 by EP shows sinus rhythm and was normal I reviewed his smart phone adapter rhythm strips greater than 20 all sinus rhythm occasional APCs no episodes of atrial fibrillation  Recent Labs: 01/02/2021: BUN 16; Creatinine, Ser 0.98; Hemoglobin 15.3; Platelets 205; Potassium 4.6; Sodium  138  Recent Lipid Panel    Component Value Date/Time   CHOL 180 01/27/2020 1354   TRIG 150 (H) 01/27/2020 1354   HDL 50 01/27/2020 1354   CHOLHDL 3.6 01/27/2020 1354   LDLCALC 104 (H) 01/27/2020 1354    Physical Exam:    VS:  BP 116/82    Pulse 80    Ht 5\' 9"  (1.753 m)    Wt 190 lb 1.9 oz (86.2 kg)    SpO2 97%    BMI 28.08 kg/m     Wt Readings from Last 3 Encounters:  08/24/21 190 lb 1.9 oz (86.2 kg)  01/02/21 191 lb 0.6 oz (86.7 kg)  08/05/20 190 lb (  86.2 kg)     GEN:  Well nourished, well developed in no acute distress HEENT: Normal NECK: No JVD; No carotid bruits LYMPHATICS: No lymphadenopathy CARDIAC: RRR, no murmurs, rubs, gallops RESPIRATORY:  Clear to auscultation without rales, wheezing or rhonchi  ABDOMEN: Soft, non-tender, non-distended MUSCULOSKELETAL:  No edema; No deformity  SKIN: Warm and dry NEUROLOGIC:  Alert and oriented x 3 PSYCHIATRIC:  Normal affect    Signed, Shirlee More, MD  08/24/2021 9:39 AM    [place]

## 2021-09-14 DIAGNOSIS — M25551 Pain in right hip: Secondary | ICD-10-CM | POA: Diagnosis not present

## 2021-10-02 ENCOUNTER — Other Ambulatory Visit: Payer: Self-pay | Admitting: Cardiology

## 2021-10-31 DIAGNOSIS — H35372 Puckering of macula, left eye: Secondary | ICD-10-CM | POA: Diagnosis not present

## 2021-10-31 DIAGNOSIS — H2513 Age-related nuclear cataract, bilateral: Secondary | ICD-10-CM | POA: Diagnosis not present

## 2021-10-31 DIAGNOSIS — H25013 Cortical age-related cataract, bilateral: Secondary | ICD-10-CM | POA: Diagnosis not present

## 2021-10-31 DIAGNOSIS — H04123 Dry eye syndrome of bilateral lacrimal glands: Secondary | ICD-10-CM | POA: Diagnosis not present

## 2022-04-02 ENCOUNTER — Other Ambulatory Visit: Payer: Self-pay

## 2022-04-02 MED ORDER — ROSUVASTATIN CALCIUM 10 MG PO TABS: ORAL_TABLET | ORAL | 3 refills | Status: DC

## 2022-05-08 DIAGNOSIS — H40013 Open angle with borderline findings, low risk, bilateral: Secondary | ICD-10-CM | POA: Diagnosis not present

## 2022-08-02 DIAGNOSIS — Z23 Encounter for immunization: Secondary | ICD-10-CM | POA: Diagnosis not present

## 2022-08-02 DIAGNOSIS — Z6827 Body mass index (BMI) 27.0-27.9, adult: Secondary | ICD-10-CM | POA: Diagnosis not present

## 2022-08-02 DIAGNOSIS — Z0000 Encounter for general adult medical examination without abnormal findings: Secondary | ICD-10-CM | POA: Diagnosis not present

## 2022-08-09 DIAGNOSIS — D509 Iron deficiency anemia, unspecified: Secondary | ICD-10-CM | POA: Diagnosis not present

## 2022-08-09 DIAGNOSIS — Z0000 Encounter for general adult medical examination without abnormal findings: Secondary | ICD-10-CM | POA: Diagnosis not present

## 2022-08-09 DIAGNOSIS — E1169 Type 2 diabetes mellitus with other specified complication: Secondary | ICD-10-CM | POA: Diagnosis not present

## 2022-08-09 DIAGNOSIS — Z1211 Encounter for screening for malignant neoplasm of colon: Secondary | ICD-10-CM | POA: Diagnosis not present

## 2022-08-09 DIAGNOSIS — E782 Mixed hyperlipidemia: Secondary | ICD-10-CM | POA: Diagnosis not present

## 2022-08-09 DIAGNOSIS — I10 Essential (primary) hypertension: Secondary | ICD-10-CM | POA: Diagnosis not present

## 2022-08-09 DIAGNOSIS — H9190 Unspecified hearing loss, unspecified ear: Secondary | ICD-10-CM | POA: Diagnosis not present

## 2022-08-28 DIAGNOSIS — K080 Exfoliation of teeth due to systemic causes: Secondary | ICD-10-CM | POA: Diagnosis not present

## 2022-09-18 DIAGNOSIS — H903 Sensorineural hearing loss, bilateral: Secondary | ICD-10-CM | POA: Diagnosis not present

## 2022-09-18 DIAGNOSIS — H6123 Impacted cerumen, bilateral: Secondary | ICD-10-CM | POA: Diagnosis not present

## 2022-09-19 DIAGNOSIS — D485 Neoplasm of uncertain behavior of skin: Secondary | ICD-10-CM | POA: Diagnosis not present

## 2022-10-31 DIAGNOSIS — C44329 Squamous cell carcinoma of skin of other parts of face: Secondary | ICD-10-CM | POA: Diagnosis not present

## 2022-11-07 DIAGNOSIS — L905 Scar conditions and fibrosis of skin: Secondary | ICD-10-CM | POA: Diagnosis not present

## 2022-11-14 DIAGNOSIS — H5203 Hypermetropia, bilateral: Secondary | ICD-10-CM | POA: Diagnosis not present

## 2022-11-14 DIAGNOSIS — H524 Presbyopia: Secondary | ICD-10-CM | POA: Diagnosis not present

## 2022-11-14 DIAGNOSIS — H43813 Vitreous degeneration, bilateral: Secondary | ICD-10-CM | POA: Diagnosis not present

## 2022-11-14 DIAGNOSIS — H40013 Open angle with borderline findings, low risk, bilateral: Secondary | ICD-10-CM | POA: Diagnosis not present

## 2022-11-14 DIAGNOSIS — H35372 Puckering of macula, left eye: Secondary | ICD-10-CM | POA: Diagnosis not present

## 2022-11-14 DIAGNOSIS — H52223 Regular astigmatism, bilateral: Secondary | ICD-10-CM | POA: Diagnosis not present

## 2022-11-14 DIAGNOSIS — H353131 Nonexudative age-related macular degeneration, bilateral, early dry stage: Secondary | ICD-10-CM | POA: Diagnosis not present

## 2023-01-30 DIAGNOSIS — D6869 Other thrombophilia: Secondary | ICD-10-CM | POA: Diagnosis not present

## 2023-01-30 DIAGNOSIS — Z87891 Personal history of nicotine dependence: Secondary | ICD-10-CM | POA: Diagnosis not present

## 2023-02-01 DIAGNOSIS — R972 Elevated prostate specific antigen [PSA]: Secondary | ICD-10-CM | POA: Diagnosis not present

## 2023-02-01 DIAGNOSIS — E1169 Type 2 diabetes mellitus with other specified complication: Secondary | ICD-10-CM | POA: Diagnosis not present

## 2023-02-01 DIAGNOSIS — I10 Essential (primary) hypertension: Secondary | ICD-10-CM | POA: Diagnosis not present

## 2023-02-01 DIAGNOSIS — E119 Type 2 diabetes mellitus without complications: Secondary | ICD-10-CM | POA: Diagnosis not present

## 2023-02-11 DIAGNOSIS — L814 Other melanin hyperpigmentation: Secondary | ICD-10-CM | POA: Diagnosis not present

## 2023-02-11 DIAGNOSIS — W908XXS Exposure to other nonionizing radiation, sequela: Secondary | ICD-10-CM | POA: Diagnosis not present

## 2023-02-11 DIAGNOSIS — L578 Other skin changes due to chronic exposure to nonionizing radiation: Secondary | ICD-10-CM | POA: Diagnosis not present

## 2023-02-11 DIAGNOSIS — L570 Actinic keratosis: Secondary | ICD-10-CM | POA: Diagnosis not present

## 2023-02-11 DIAGNOSIS — L821 Other seborrheic keratosis: Secondary | ICD-10-CM | POA: Diagnosis not present

## 2023-03-06 DIAGNOSIS — K080 Exfoliation of teeth due to systemic causes: Secondary | ICD-10-CM | POA: Diagnosis not present

## 2023-03-15 DIAGNOSIS — R972 Elevated prostate specific antigen [PSA]: Secondary | ICD-10-CM | POA: Diagnosis not present

## 2023-06-02 ENCOUNTER — Other Ambulatory Visit: Payer: Self-pay | Admitting: Cardiology

## 2023-06-07 DIAGNOSIS — H35372 Puckering of macula, left eye: Secondary | ICD-10-CM | POA: Diagnosis not present

## 2023-06-07 DIAGNOSIS — H43813 Vitreous degeneration, bilateral: Secondary | ICD-10-CM | POA: Diagnosis not present

## 2023-06-07 DIAGNOSIS — H353131 Nonexudative age-related macular degeneration, bilateral, early dry stage: Secondary | ICD-10-CM | POA: Diagnosis not present

## 2023-06-07 DIAGNOSIS — H40013 Open angle with borderline findings, low risk, bilateral: Secondary | ICD-10-CM | POA: Diagnosis not present

## 2023-06-29 ENCOUNTER — Other Ambulatory Visit: Payer: Self-pay | Admitting: Cardiology

## 2023-08-05 DIAGNOSIS — Z0000 Encounter for general adult medical examination without abnormal findings: Secondary | ICD-10-CM | POA: Diagnosis not present

## 2023-08-05 DIAGNOSIS — I10 Essential (primary) hypertension: Secondary | ICD-10-CM | POA: Diagnosis not present

## 2023-08-05 DIAGNOSIS — D509 Iron deficiency anemia, unspecified: Secondary | ICD-10-CM | POA: Diagnosis not present

## 2023-08-05 DIAGNOSIS — Z23 Encounter for immunization: Secondary | ICD-10-CM | POA: Diagnosis not present

## 2023-08-05 DIAGNOSIS — E1169 Type 2 diabetes mellitus with other specified complication: Secondary | ICD-10-CM | POA: Diagnosis not present

## 2023-08-05 DIAGNOSIS — E782 Mixed hyperlipidemia: Secondary | ICD-10-CM | POA: Diagnosis not present

## 2023-08-05 DIAGNOSIS — Z125 Encounter for screening for malignant neoplasm of prostate: Secondary | ICD-10-CM | POA: Diagnosis not present

## 2023-08-08 DIAGNOSIS — R972 Elevated prostate specific antigen [PSA]: Secondary | ICD-10-CM | POA: Diagnosis not present

## 2023-08-08 DIAGNOSIS — I10 Essential (primary) hypertension: Secondary | ICD-10-CM | POA: Diagnosis not present

## 2023-08-08 DIAGNOSIS — L821 Other seborrheic keratosis: Secondary | ICD-10-CM | POA: Diagnosis not present

## 2023-08-08 DIAGNOSIS — D1801 Hemangioma of skin and subcutaneous tissue: Secondary | ICD-10-CM | POA: Diagnosis not present

## 2023-08-08 DIAGNOSIS — E782 Mixed hyperlipidemia: Secondary | ICD-10-CM | POA: Diagnosis not present

## 2023-08-08 DIAGNOSIS — L570 Actinic keratosis: Secondary | ICD-10-CM | POA: Diagnosis not present

## 2023-08-08 DIAGNOSIS — E1169 Type 2 diabetes mellitus with other specified complication: Secondary | ICD-10-CM | POA: Diagnosis not present

## 2023-08-08 DIAGNOSIS — L814 Other melanin hyperpigmentation: Secondary | ICD-10-CM | POA: Diagnosis not present

## 2023-08-08 DIAGNOSIS — E119 Type 2 diabetes mellitus without complications: Secondary | ICD-10-CM | POA: Diagnosis not present

## 2023-09-12 DIAGNOSIS — K080 Exfoliation of teeth due to systemic causes: Secondary | ICD-10-CM | POA: Diagnosis not present

## 2023-10-01 DIAGNOSIS — R972 Elevated prostate specific antigen [PSA]: Secondary | ICD-10-CM | POA: Diagnosis not present

## 2023-10-09 ENCOUNTER — Ambulatory Visit (INDEPENDENT_AMBULATORY_CARE_PROVIDER_SITE_OTHER): Payer: Medicare Other

## 2023-11-18 ENCOUNTER — Ambulatory Visit (INDEPENDENT_AMBULATORY_CARE_PROVIDER_SITE_OTHER): Payer: Medicare Other

## 2023-11-18 ENCOUNTER — Encounter (INDEPENDENT_AMBULATORY_CARE_PROVIDER_SITE_OTHER): Payer: Self-pay

## 2023-11-18 VITALS — BP 120/72 | HR 83 | Ht 69.0 in | Wt 196.0 lb

## 2023-11-18 DIAGNOSIS — H6123 Impacted cerumen, bilateral: Secondary | ICD-10-CM | POA: Diagnosis not present

## 2023-11-18 DIAGNOSIS — H903 Sensorineural hearing loss, bilateral: Secondary | ICD-10-CM | POA: Diagnosis not present

## 2023-11-18 NOTE — Progress Notes (Unsigned)
 Patient ID: Gregory Vasquez, male   DOB: [birth year], 78 y.o.   MRN: [id number]  Follow-up: Bilateral hearing loss  HPI: The patient is a 78 year old male who returns today for his follow-up evaluation.  He was last seen 1 year ago.  At that time, he was complaining of bilateral hearing loss.  He was noted to have bilateral high-frequency sensorineural hearing loss, likely secondary to routine presbycusis.  He was fitted with bilateral hearing aids.  The patient returns today complaining of persistent hearing difficulty, especially in noisy environments.  He denies any recent change in his hearing.  The hearing aids have helped.  Currently he denies any otalgia or otorrhea.  Exam: General: Communicates without difficulty, well nourished, no acute distress. Head: Normocephalic, no evidence injury, no tenderness, facial buttresses intact without stepoff. Face/sinus: No tenderness to palpation and percussion. Facial movement is normal and symmetric. Eyes: PERRL, EOMI. No scleral icterus, conjunctivae clear. Neuro: CN II exam reveals vision grossly intact.  No nystagmus at any point of gaze. Ears: Auricles well formed without lesions.  Bilateral cerumen impaction.  Nose: External evaluation reveals normal support and skin without lesions.  Dorsum is intact.  Anterior rhinoscopy reveals congested mucosa over anterior aspect of inferior turbinates and intact septum.  No purulence noted. Oral:  Oral cavity and oropharynx are intact, symmetric, without erythema or edema.  Mucosa is moist without lesions. Neck: Full range of motion without pain.  There is no significant lymphadenopathy.  No masses palpable.  Thyroid bed within normal limits to palpation.  Parotid glands and submandibular glands equal bilaterally without mass.  Trachea is midline. Neuro:  CN 2-12 grossly intact.   Procedure: Bilateral cerumen disimpaction Anesthesia: None Description: Under the operating microscope, the cerumen is carefully removed  with a combination of cerumen currette, alligator forceps, and suction catheters.  After the cerumen is removed, the TMs are noted to be normal.  No mass, erythema, or lesions. The patient tolerated the procedure well.    Assessment: 1.  Bilateral cerumen impaction.  After the disimpaction procedure, both tympanic membranes and middle ear spaces are noted to be normal. 2.  Subjectively stable bilateral high-frequency sensorineural hearing loss.  Plan: 1.  Otomicroscopy with bilateral cerumen disimpaction. 2.  The physical exam findings are reviewed with the patient. 3.  Continue the use of his hearing aids. 4.  The patient will return for reevaluation in 1 year.

## 2023-11-19 DIAGNOSIS — H903 Sensorineural hearing loss, bilateral: Secondary | ICD-10-CM | POA: Insufficient documentation

## 2023-11-19 DIAGNOSIS — H6123 Impacted cerumen, bilateral: Secondary | ICD-10-CM | POA: Insufficient documentation

## 2023-12-13 DIAGNOSIS — H52223 Regular astigmatism, bilateral: Secondary | ICD-10-CM | POA: Diagnosis not present

## 2023-12-13 DIAGNOSIS — H353121 Nonexudative age-related macular degeneration, left eye, early dry stage: Secondary | ICD-10-CM | POA: Diagnosis not present

## 2023-12-13 DIAGNOSIS — H2513 Age-related nuclear cataract, bilateral: Secondary | ICD-10-CM | POA: Diagnosis not present

## 2023-12-13 DIAGNOSIS — H5203 Hypermetropia, bilateral: Secondary | ICD-10-CM | POA: Diagnosis not present

## 2023-12-13 DIAGNOSIS — H353112 Nonexudative age-related macular degeneration, right eye, intermediate dry stage: Secondary | ICD-10-CM | POA: Diagnosis not present

## 2023-12-13 DIAGNOSIS — H35372 Puckering of macula, left eye: Secondary | ICD-10-CM | POA: Diagnosis not present

## 2023-12-13 DIAGNOSIS — H524 Presbyopia: Secondary | ICD-10-CM | POA: Diagnosis not present

## 2023-12-26 DIAGNOSIS — H43813 Vitreous degeneration, bilateral: Secondary | ICD-10-CM | POA: Diagnosis not present

## 2023-12-26 DIAGNOSIS — D3132 Benign neoplasm of left choroid: Secondary | ICD-10-CM | POA: Diagnosis not present

## 2023-12-26 DIAGNOSIS — H35373 Puckering of macula, bilateral: Secondary | ICD-10-CM | POA: Diagnosis not present

## 2023-12-26 DIAGNOSIS — H353112 Nonexudative age-related macular degeneration, right eye, intermediate dry stage: Secondary | ICD-10-CM | POA: Diagnosis not present

## 2023-12-26 DIAGNOSIS — H353121 Nonexudative age-related macular degeneration, left eye, early dry stage: Secondary | ICD-10-CM | POA: Diagnosis not present

## 2024-01-07 DIAGNOSIS — D6869 Other thrombophilia: Secondary | ICD-10-CM | POA: Diagnosis not present

## 2024-02-04 DIAGNOSIS — E1169 Type 2 diabetes mellitus with other specified complication: Secondary | ICD-10-CM | POA: Diagnosis not present

## 2024-02-04 DIAGNOSIS — S30861A Insect bite (nonvenomous) of abdominal wall, initial encounter: Secondary | ICD-10-CM | POA: Diagnosis not present

## 2024-02-04 DIAGNOSIS — S30860A Insect bite (nonvenomous) of lower back and pelvis, initial encounter: Secondary | ICD-10-CM | POA: Diagnosis not present

## 2024-02-04 DIAGNOSIS — I10 Essential (primary) hypertension: Secondary | ICD-10-CM | POA: Diagnosis not present

## 2024-03-17 DIAGNOSIS — K080 Exfoliation of teeth due to systemic causes: Secondary | ICD-10-CM | POA: Diagnosis not present

## 2024-06-16 DIAGNOSIS — H40013 Open angle with borderline findings, low risk, bilateral: Secondary | ICD-10-CM | POA: Diagnosis not present

## 2024-06-16 DIAGNOSIS — H353121 Nonexudative age-related macular degeneration, left eye, early dry stage: Secondary | ICD-10-CM | POA: Diagnosis not present

## 2024-06-16 DIAGNOSIS — H353112 Nonexudative age-related macular degeneration, right eye, intermediate dry stage: Secondary | ICD-10-CM | POA: Diagnosis not present

## 2024-06-16 DIAGNOSIS — H35372 Puckering of macula, left eye: Secondary | ICD-10-CM | POA: Diagnosis not present

## 2024-08-03 ENCOUNTER — Other Ambulatory Visit: Payer: Self-pay | Admitting: Medical Genetics

## 2024-09-21 ENCOUNTER — Other Ambulatory Visit (HOSPITAL_COMMUNITY)

## 2024-10-26 ENCOUNTER — Other Ambulatory Visit (HOSPITAL_COMMUNITY)
